# Patient Record
Sex: Female | Born: 1980 | Race: White | Hispanic: No | Smoking: Former smoker
Health system: Southern US, Community
[De-identification: ages and names within clinical notes are randomized; demographics above are authoritative.]

## PROBLEM LIST (undated history)

## (undated) DIAGNOSIS — I10 Essential (primary) hypertension: Secondary | ICD-10-CM

## (undated) DIAGNOSIS — D649 Anemia, unspecified: Secondary | ICD-10-CM

## (undated) HISTORY — PX: CHOLECYSTECTOMY: SHX55

---

## 2005-10-18 ENCOUNTER — Emergency Department: Payer: Self-pay | Admitting: Internal Medicine

## 2006-02-22 ENCOUNTER — Emergency Department: Payer: Self-pay | Admitting: General Practice

## 2006-08-09 ENCOUNTER — Emergency Department: Payer: Self-pay | Admitting: Emergency Medicine

## 2006-11-03 ENCOUNTER — Emergency Department: Payer: Self-pay | Admitting: Unknown Physician Specialty

## 2006-11-05 ENCOUNTER — Emergency Department: Payer: Self-pay | Admitting: Emergency Medicine

## 2007-01-08 ENCOUNTER — Emergency Department: Payer: Self-pay | Admitting: Emergency Medicine

## 2008-01-10 ENCOUNTER — Emergency Department: Payer: Self-pay | Admitting: Internal Medicine

## 2008-01-14 ENCOUNTER — Emergency Department: Payer: Self-pay | Admitting: Emergency Medicine

## 2008-02-05 ENCOUNTER — Emergency Department: Payer: Self-pay | Admitting: Emergency Medicine

## 2008-02-14 ENCOUNTER — Emergency Department: Payer: Self-pay | Admitting: Emergency Medicine

## 2008-05-11 ENCOUNTER — Emergency Department: Payer: Self-pay | Admitting: Emergency Medicine

## 2008-07-16 ENCOUNTER — Emergency Department: Payer: Self-pay

## 2008-08-26 ENCOUNTER — Emergency Department: Payer: Self-pay | Admitting: Emergency Medicine

## 2008-11-08 ENCOUNTER — Observation Stay: Payer: Self-pay | Admitting: Obstetrics and Gynecology

## 2009-01-07 ENCOUNTER — Observation Stay: Payer: Self-pay

## 2009-01-29 ENCOUNTER — Observation Stay: Payer: Self-pay | Admitting: Unknown Physician Specialty

## 2009-02-10 ENCOUNTER — Emergency Department: Payer: Self-pay | Admitting: Emergency Medicine

## 2009-02-18 ENCOUNTER — Observation Stay: Payer: Self-pay | Admitting: Obstetrics and Gynecology

## 2009-03-18 ENCOUNTER — Inpatient Hospital Stay: Payer: Self-pay | Admitting: Unknown Physician Specialty

## 2010-05-26 ENCOUNTER — Inpatient Hospital Stay: Payer: Self-pay | Admitting: Obstetrics and Gynecology

## 2010-08-02 ENCOUNTER — Emergency Department: Payer: Self-pay | Admitting: Emergency Medicine

## 2010-08-03 ENCOUNTER — Emergency Department: Payer: Self-pay | Admitting: Emergency Medicine

## 2011-07-22 ENCOUNTER — Emergency Department: Payer: Self-pay | Admitting: Emergency Medicine

## 2012-05-07 ENCOUNTER — Emergency Department: Payer: Self-pay | Admitting: Emergency Medicine

## 2012-05-07 LAB — URINALYSIS, COMPLETE
Bacteria: NONE SEEN
Bilirubin,UR: NEGATIVE
Glucose,UR: NEGATIVE mg/dL (ref 0–75)
Ketone: NEGATIVE
Nitrite: NEGATIVE
Ph: 6 (ref 4.5–8.0)
Protein: NEGATIVE
RBC,UR: 3 /HPF (ref 0–5)
Specific Gravity: 1.021 (ref 1.003–1.030)
Squamous Epithelial: 18
WBC UR: 13 /HPF (ref 0–5)

## 2012-05-07 LAB — COMPREHENSIVE METABOLIC PANEL
Albumin: 4.1 g/dL (ref 3.4–5.0)
Alkaline Phosphatase: 84 U/L (ref 50–136)
Anion Gap: 3 — ABNORMAL LOW (ref 7–16)
BUN: 12 mg/dL (ref 7–18)
Bilirubin,Total: 0.5 mg/dL (ref 0.2–1.0)
Calcium, Total: 8.7 mg/dL (ref 8.5–10.1)
Chloride: 105 mmol/L (ref 98–107)
Co2: 29 mmol/L (ref 21–32)
Creatinine: 0.83 mg/dL (ref 0.60–1.30)
EGFR (African American): >60
EGFR (Non-African Amer.): >60
Glucose: 88 mg/dL (ref 65–99)
Osmolality: 273 (ref 275–301)
Potassium: 3.8 mmol/L (ref 3.5–5.1)
SGOT(AST): 15 U/L (ref 15–37)
SGPT (ALT): 19 U/L (ref 12–78)
Sodium: 137 mmol/L (ref 136–145)
Total Protein: 7.7 g/dL (ref 6.4–8.2)

## 2012-05-07 LAB — CBC
HCT: 40.8 % (ref 35.0–47.0)
HGB: 13.8 g/dL (ref 12.0–16.0)
MCH: 30.2 pg (ref 26.0–34.0)
MCHC: 33.9 g/dL (ref 32.0–36.0)
MCV: 89 fL (ref 80–100)
Platelet: 204 10*3/uL (ref 150–440)
RBC: 4.58 10*6/uL (ref 3.80–5.20)
RDW: 13.4 % (ref 11.5–14.5)
WBC: 10.7 10*3/uL (ref 3.6–11.0)

## 2012-05-07 LAB — LIPASE, BLOOD: Lipase: 116 U/L (ref 73–393)

## 2012-05-08 LAB — CBC
HCT: 41.4 % (ref 35.0–47.0)
HGB: 13.7 g/dL (ref 12.0–16.0)
MCH: 30 pg (ref 26.0–34.0)
MCHC: 33.1 g/dL (ref 32.0–36.0)
MCV: 91 fL (ref 80–100)
Platelet: 207 10*3/uL (ref 150–440)
RBC: 4.58 10*6/uL (ref 3.80–5.20)
RDW: 13.6 % (ref 11.5–14.5)
WBC: 6.6 10*3/uL (ref 3.6–11.0)

## 2012-12-26 ENCOUNTER — Emergency Department: Payer: Self-pay | Admitting: Emergency Medicine

## 2014-04-11 ENCOUNTER — Emergency Department: Payer: Self-pay | Admitting: Emergency Medicine

## 2014-05-25 LAB — URINALYSIS, COMPLETE
Bilirubin,UR: NEGATIVE
Blood: NEGATIVE
Glucose,UR: NEGATIVE mg/dL (ref 0–75)
Ketone: NEGATIVE
Nitrite: NEGATIVE
Ph: 8 (ref 4.5–8.0)
Protein: 30
RBC,UR: 7 /HPF (ref 0–5)
Specific Gravity: 1.017 (ref 1.003–1.030)
Squamous Epithelial: 2
WBC UR: 28 /HPF (ref 0–5)

## 2014-05-25 LAB — COMPREHENSIVE METABOLIC PANEL
Albumin: 4.4 g/dL
Alkaline Phosphatase: 63 U/L
Anion Gap: 7 (ref 7–16)
BUN: 13 mg/dL
Bilirubin,Total: 0.3 mg/dL
Calcium, Total: 9.1 mg/dL
Chloride: 106 mmol/L
Co2: 26 mmol/L
Creatinine: 1.05 mg/dL — ABNORMAL HIGH
EGFR (African American): >60
EGFR (Non-African Amer.): >60
Glucose: 114 mg/dL — ABNORMAL HIGH
Potassium: 3.7 mmol/L
SGOT(AST): 21 U/L
SGPT (ALT): 16 U/L
Sodium: 139 mmol/L
Total Protein: 7.5 g/dL

## 2014-05-25 LAB — CBC WITH DIFFERENTIAL/PLATELET
Basophil #: 0.1 10*3/uL (ref 0.0–0.1)
Basophil %: 0.7 %
Eosinophil #: 0.3 10*3/uL (ref 0.0–0.7)
Eosinophil %: 2.5 %
HCT: 42.2 % (ref 35.0–47.0)
HGB: 14.1 g/dL (ref 12.0–16.0)
Lymphocyte #: 2.7 10*3/uL (ref 1.0–3.6)
Lymphocyte %: 25.5 %
MCH: 31.2 pg (ref 26.0–34.0)
MCHC: 33.5 g/dL (ref 32.0–36.0)
MCV: 93 fL (ref 80–100)
Monocyte #: 0.5 x10 3/mm (ref 0.2–0.9)
Monocyte %: 5.3 %
Neutrophil #: 6.9 10*3/uL — ABNORMAL HIGH (ref 1.4–6.5)
Neutrophil %: 66 %
Platelet: 229 10*3/uL (ref 150–440)
RBC: 4.54 10*6/uL (ref 3.80–5.20)
RDW: 13.2 % (ref 11.5–14.5)
WBC: 10.4 10*3/uL (ref 3.6–11.0)

## 2014-05-25 LAB — LIPASE, BLOOD: Lipase: 55 U/L — ABNORMAL HIGH

## 2014-05-25 LAB — TROPONIN I: Troponin-I: <0.03 ng/mL

## 2014-05-26 ENCOUNTER — Observation Stay
Admit: 2014-05-26 | Disposition: A | Discharge status: Home / Self Care | Payer: Self-pay | Attending: Surgery | Admitting: Surgery

## 2014-06-18 LAB — SURGICAL PATHOLOGY

## 2014-06-23 ENCOUNTER — Emergency Department: Admit: 2014-06-23 | Disposition: A | Discharge status: Home / Self Care | Payer: Self-pay | Admitting: Internal Medicine

## 2014-06-24 NOTE — Op Note (Signed)
PATIENT NAME:  Carmen Turner, Carmen A MR#:  [id number] DATE OF BIRTH:  07/18/1980  DATE OF PROCEDURE:  05/26/2014  OPERATION PERFORMED: Laparoscopic cholecystectomy.   PREOPERATIVE DIAGNOSIS: Acute cholecystitis.   POSTOPERATIVE DIAGNOSIS: Acute cholecystitis, mild.   SURGEON: Claude MangesWilliam F. Marterre, MD   ANESTHESIA: General.   PROCEDURE IN DETAIL: The patient was placed supine on the operating room table and prepped and draped in the usual sterile fashion. A 15 mmHg CO2 pneumoperitoneum was created via a Veress needle in the infraumbilical position, and this was replaced with a 5 mm trocar and a 30 degrees angled laparoscope. Remaining trocars were placed under direct visualization. The fundus of the gallbladder was retracted superiorly and ventrally and there was a significant amount of edema in the triangle of Calot, but there was no exudate. Adhesions were minimal. Dissection within the triangle of Calot was facilitated by retracting the infundibulum of the gallbladder laterally and cystic duct was identified going into the infundibulum and this was doubly clipped and divided. There were multiple small cystic artery branches and the patient had  oozing from little tiny skin sites such as produced by cutting needles and, therefore, I placed a multiple hemoclips on these multiple small branches, but none of these clips encroached upon the area of the porta hepatis or common bile duct. The gallbladder was removed from the liver bed with the electrocautery, placed in an EndoCatch bag and extracted from the abdomen via the epigastric port. This port site fascia was closed with a single 0 Vicryl suture using the laparoscopic puncture closure device. The right upper quadrant was irrigated with copious amounts of warm normal saline and this was all suctioned clear. Hemostasis was excellent and the clips were secure at the conclusion of the procedure and, therefore, the peritoneum was desufflated and decannulated and  all 4 skin sites were closed with subcuticular 5-0 Monocryl and suture strips. The patient tolerated the procedure well. There were no complications.    ____________________________ Claude MangesWilliam F. Marterre, MD wfm:tr D: 05/26/2014 09:37:36 ET T: 05/26/2014 11:55:33 ET JOB#: [id number]  cc: Claude MangesWilliam F. Marterre, MD, <Dictator> Claude MangesWILLIAM F MARTERRE MD ELECTRONICALLY SIGNED 05/26/2014 16:23

## 2014-06-24 NOTE — H&P (Signed)
   Subjective/Chief Complaint Epigastric pain x 18 hours   History of Present Illness 34 yo F with history of c section who presents with now approx 18 hours of epigastric pain.  Has had multiple episodes over the past few years, had ultrasound in February which shows gallstones.  + N/V due to pain, chills.  Pain slightly improved in ER after pain meds.  No sick contracts, no unusual ingestions.  No fevers, night sweats, cough, chest pain, shortness of breath, diarrhea/constipation, dysuria/hematuria.   Past History H/o c section   Code Status Full Code   Past Med/Surgical Hx:  intestinal blockage at [age]yrs old:   Cesarean Section:   Denies surgical history.:   ALLERGIES:  Ibuprofen: Swelling  Family and Social History:  Family History Cancer   Social History negative tobacco, positive ETOH, Social EtOH   Place of Living Home   Review of Systems:  Subjective/Chief Complaint Epigastric pain, N/V.  Full ROS obtained, pertinent positives and negatives above in HPI and below   Fever/Chills Yes   Cough No   Sputum No   Abdominal Pain Yes   Diarrhea No   Constipation No   Nausea/Vomiting Yes   SOB/DOE No   Chest Pain No   Dysuria No   Tolerating Diet No  Nauseated  Vomiting   Physical Exam:  GEN well developed, no acute distress   HEENT pink conjunctivae, PERRL, hearing intact to voice, good dentition   RESP normal resp effort  clear BS  no use of accessory muscles   CARD regular rate  no murmur  no thrills  Mildly tender epigastric   ABD positive tenderness  no hernia  soft   EXTR negative cyanosis/clubbing, negative edema   SKIN normal to palpation, No rashes, No ulcers, skin turgor good   NEURO cranial nerves intact, negative rigidity, negative tremor, follows commands, strength:, motor/sensory function intact   PSYCH A+O to time, place, person, good insight    Assessment/Admission Diagnosis 34 yo with epigastric pain, recurrent, N/V.  Labs nl, VSS  but U/S shows gallstone lodged in neck of gallbladder.  Suspect gallbladder hydrops.   Plan Admit, IV fluids, IV abx.  NPO.  Plan laparoscopic cholecystectomy at discretion of daytime surgeon Dr. Anda KraftMarterre.   Electronic Signatures: Jarvis NewcomerLundquist, Christopher A (MD)  (Signed 02-Apr-16 01:01)  Authored: CHIEF COMPLAINT and HISTORY, PAST MEDICAL/SURGIAL HISTORY, ALLERGIES, FAMILY AND SOCIAL HISTORY, REVIEW OF SYSTEMS, PHYSICAL EXAM, ASSESSMENT AND PLAN   Last Updated: 02-Apr-16 01:01 by Jarvis NewcomerLundquist, Christopher A (MD)

## 2014-07-08 ENCOUNTER — Encounter: Payer: Self-pay | Admitting: Emergency Medicine

## 2014-07-08 ENCOUNTER — Emergency Department: Payer: Medicaid Other

## 2014-07-08 ENCOUNTER — Emergency Department
Admission: EM | Admit: 2014-07-08 | Discharge: 2014-07-08 | Disposition: A | Discharge status: Home / Self Care | Payer: Medicaid Other | Attending: Emergency Medicine | Admitting: Emergency Medicine

## 2014-07-08 DIAGNOSIS — Y9389 Activity, other specified: Secondary | ICD-10-CM | POA: Insufficient documentation

## 2014-07-08 DIAGNOSIS — S92301A Fracture of unspecified metatarsal bone(s), right foot, initial encounter for closed fracture: Secondary | ICD-10-CM

## 2014-07-08 DIAGNOSIS — Y998 Other external cause status: Secondary | ICD-10-CM | POA: Insufficient documentation

## 2014-07-08 DIAGNOSIS — S62306A Unspecified fracture of fifth metacarpal bone, right hand, initial encounter for closed fracture: Secondary | ICD-10-CM | POA: Insufficient documentation

## 2014-07-08 DIAGNOSIS — W231XXA Caught, crushed, jammed, or pinched between stationary objects, initial encounter: Secondary | ICD-10-CM | POA: Insufficient documentation

## 2014-07-08 DIAGNOSIS — Y9289 Other specified places as the place of occurrence of the external cause: Secondary | ICD-10-CM | POA: Insufficient documentation

## 2014-07-08 MED ORDER — TRAMADOL HCL 50 MG PO TABS
50.0000 mg | ORAL_TABLET | Freq: Four times a day (QID) | ORAL | Status: DC | PRN
Start: 2014-07-08 — End: 2015-11-09

## 2014-07-08 NOTE — ED Notes (Signed)
Pt says her daughter accidentally closed her right hand in a doorway in their house; pt with swelling across the back of her hand/knuckle area and bruising to palm; pt unable to move 4th and 5th digits due to increased pain;

## 2014-07-08 NOTE — ED Provider Notes (Signed)
CSN: [id number]     Arrival date & time 07/08/14  2029 History   First MD Initiated Contact with Patient 07/08/14 2150     Chief Complaint  Patient presents with  . Hand Injury     (Consider location/radiation/quality/duration/timing/severity/associated sxs/prior Treatment) HPI patient notes that her hand was caught in her door 4 days ago has had increased swelling pain and discomfort is here today for evaluation of possible fracture says she is hardly able to move her fourth and fifth digits should increase in pain pain is limited when she holds her hands steady denies numbness tingling weakness in the extremity it's her overall pain at the worst is about 8 out of 10  History reviewed. No pertinent past medical history. Past Surgical History  Procedure Laterality Date  . Cholecystectomy    . Cesarean section     No family history on file. History  Substance Use Topics  . Smoking status: Never Smoker   . Smokeless tobacco: Never Used  . Alcohol Use: Yes   OB History    No data available     Review of Systems  Constitutional: Negative for fever. Cardiovascular: Negative for chest pain. Respiratory: Negative for shortness of breath. Gastrointestinal: Negative for abdominal pain, vomiting and diarrhea. Genitourinary: Negative for dysuria. Musculoskeletal: Negative for back pain. Pain and swelling to the right hand Skin: Negative for rash.  Neurological: Negative for headaches, focal weakness or numbness.  Allergies  Ibuprofen  Home Medications   Prior to Admission medications   Medication Sig Start Date End Date Taking? Authorizing Provider  traMADol (ULTRAM) 50 MG tablet Take 1 tablet (50 mg total) by mouth every 6 (six) hours as needed. 07/08/14   III William C Ruffian, PA-C   BP 154/96 mmHg  Pulse 96  Temp(Src) 98.1 F (36.7 C) (Oral)  Resp 20  Ht 5\' 3"  (1.6 m)  Wt 189 lb (85.73 kg)  BMI 33.49 kg/m2  SpO2 99%  LMP 06/26/2014 Physical Exam Female appearing  stated age well-developed well-nourished no acute distress vitals reviewed Head ears eyes nose neck and throat exam unremarkable Cardia vascular regular rate and rhythm no murmurs or gallops  Refill good distal pulses bilaterally Pulmonary lungs clear to auscultation bilaterally Neuro exam nonfocal cranial nerves II through XII grossly intact good sensation throughout distal extremity Skin there is bruising to the right hand over the fourth and fifth metacarpals Muscular low chest pain with palpation over the distal fifth metacarpal of the right hand limited range of motion for complete flexion of her fingers on her right hand due to both swelling and pain ED Course  Procedures patient placed in an ulnar gutter splint  Imaging Review Dg Hand 2 View Right  07/08/2014   CLINICAL DATA:  Right hand slammed in door, with swelling and bruising about the palm and dorsal aspect of the right hand. Limited range of motion. Initial encounter.  EXAM: RIGHT HAND - 2 VIEW  COMPARISON:  None.  FINDINGS: There is a slightly comminuted fracture involving the distal fifth metacarpal, with extension to the ulnar edge of the fifth metacarpophalangeal joint. No additional fractures are seen.  The joint spaces are preserved. The carpal rows are intact, and demonstrate normal alignment. No definite soft tissue abnormalities are characterized on radiograph.  IMPRESSION: Slightly comminuted fracture involving the distal fifth metacarpal, with extension to the ulnar edge of the fifth metacarpophalangeal joint.   Electronically Signed   By: Roanna RaiderJeffery  Chang M.D.   On: 07/08/2014 21:53  MDM  Patient to has bruising to her right hand and pain that has been going on since Thursday today being Sunday with findings of a slightly comminuted fracture involving the distal fifth metacarpal patient was placed in an ulnar gutter splints to follow-up with orthopedics next week 2. Elevated iced take tramadol and Tylenol as needed for  pain Final diagnoses:  Fracture of fifth metatarsal bone, right, closed, initial encounter        III Rosalyn GessWilliam C Ruffian, PA-C 07/08/14 2228  Sharyn CreamerMark Quale, MD 07/08/14 2333

## 2014-07-08 NOTE — ED Notes (Signed)
Pt has pain 8/10, capillary refill less than 3 seconds, pulses present, and pt tolerated procedure well.

## 2014-11-15 ENCOUNTER — Emergency Department
Admission: EM | Admit: 2014-11-15 | Discharge: 2014-11-15 | Disposition: A | Discharge status: Home / Self Care | Payer: Medicaid Other | Attending: Emergency Medicine | Admitting: Emergency Medicine

## 2014-11-15 ENCOUNTER — Emergency Department: Payer: Medicaid Other

## 2014-11-15 DIAGNOSIS — Z791 Long term (current) use of non-steroidal anti-inflammatories (NSAID): Secondary | ICD-10-CM | POA: Insufficient documentation

## 2014-11-15 DIAGNOSIS — M79672 Pain in left foot: Secondary | ICD-10-CM

## 2014-11-15 DIAGNOSIS — I10 Essential (primary) hypertension: Secondary | ICD-10-CM | POA: Insufficient documentation

## 2014-11-15 HISTORY — DX: Essential (primary) hypertension: I10

## 2014-11-15 MED ORDER — NAPROXEN 500 MG PO TABS: 500.0000 mg | ORAL_TABLET | Freq: Two times a day (BID) | ORAL | Status: DC

## 2014-11-15 NOTE — ED Notes (Signed)
Pt presents to ED w/ c/o L foot pain.  Pt sts it began today.  Pt denies injury or bite to extremity

## 2014-11-15 NOTE — ED Notes (Signed)
Pt's bp rechecked. States she was recently diagnosed with htn but has no insurance at this time. [facility].

## 2014-11-15 NOTE — Discharge Instructions (Signed)
Cryotherapy Cryotherapy is when you put ice on your injury. Ice helps lessen pain and puffiness (swelling) after an injury. Ice works the best when you start using it in the first 24 to 48 hours after an injury. HOME CARE  Put a dry or damp towel between the ice pack and your skin.  You may press gently on the ice pack.  Leave the ice on for no more than 10 to 20 minutes at a time.  Check your skin after 5 minutes to make sure your skin is okay.  Rest at least 20 minutes between ice pack uses.  Stop using ice when your skin loses feeling (numbness).  Do not use ice on someone who cannot tell you when it hurts. This includes small children and people with memory problems (dementia). GET HELP RIGHT AWAY IF:  You have white spots on your skin.  Your skin turns blue or pale.  Your skin feels waxy or hard.  Your puffiness gets worse. MAKE SURE YOU:   Understand these instructions.  Will watch your condition.  Will get help right away if you are not doing well or get worse. Document Released: 07/29/2007 Document Revised: 05/04/2011 Document Reviewed: 10/02/2010 ExitCare Patient Information 2015 [employer]. This information is not intended to replace advice given to you by your health care provider. Make sure you discuss any questions you have with your health care provider.  

## 2014-11-15 NOTE — ED Provider Notes (Signed)
[facility] Emergency Department Provider Note  ____________________________________________  Time seen: Approximately 11:25 AM  I have reviewed the triage vital signs and the nursing notes.   HISTORY  Chief Complaint Foot Pain    HPI Carmen Turner is a 34 y.o. female chief complaint left foot pain. Patient denies any injury to her foot or insect bite. She states this is been going on for approximately 2 days. Pain is increased with walking. She has not taken any over-the-counter medication for her pain. She denies any family history or known history of gout. Pain is mostly on the left lateral aspect of her foot. She rates her pain currently as a 7 out of 10.   Past Medical History  Diagnosis Date  . Hypertension     There are no active problems to display for this patient.   Past Surgical History  Procedure Laterality Date  . Cholecystectomy    . Cesarean section      Current Outpatient Rx  Name  Route  Sig  Dispense  Refill  . naproxen (NAPROSYN) 500 MG tablet   Oral   Take 1 tablet (500 mg total) by mouth 2 (two) times daily with a meal.   30 tablet   0   . traMADol (ULTRAM) 50 MG tablet   Oral   Take 1 tablet (50 mg total) by mouth every 6 (six) hours as needed.   12 tablet   0     Allergies Ibuprofen  No family history on file.  Social History Social History  Substance Use Topics  . Smoking status: Never Smoker   . Smokeless tobacco: Never Used  . Alcohol Use: Yes    Review of Systems Constitutional: No fever/chills Eyes: No visual changes. ENT: No sore throat. Cardiovascular: Denies chest pain. Respiratory: Denies shortness of breath. Gastrointestinal: No abdominal pain.  No nausea, no vomiting.   Musculoskeletal: Negative for back pain. Positive for left foot pain Skin: Negative for rash. Neurological: Negative for headaches, focal weakness or numbness.  10-point ROS otherwise  negative.  ____________________________________________   PHYSICAL EXAM:  VITAL SIGNS: ED Triage Vitals  Enc Vitals Group     BP 11/15/14 1107 157/107 mmHg     Pulse Rate 11/15/14 1107 67     Resp 11/15/14 1107 16     Temp 11/15/14 1107 97.9 F (36.6 C)     Temp Source 11/15/14 1107 Oral     SpO2 11/15/14 1107 99 %     Weight 11/15/14 1107 139 lb (63.05 kg)     Height 11/15/14 1107  (1.6 m)     Head Cir --      Peak Flow --      Pain Score 11/15/14 1108 7     Pain Loc --      Pain Edu? --      Excl. in GC? --     Constitutional: Alert and oriented. Well appearing and in no acute distress. Eyes: Conjunctivae are normal. PERRL. EOMI. Head: Atraumatic. Nose: No congestion/rhinnorhea. Neck: No stridor.   Cardiovascular: Normal rate, regular rhythm. Grossly normal heart sounds.  Good peripheral circulation. Respiratory: Normal respiratory effort.  No retractions. Lungs CTAB. Gastrointestinal: Soft and nontender. No distention. Musculoskeletal: Moderate tenderness on palpation of the left lateral fifth metatarsal area. No gross deformity was noted that there is some soft tissue swelling. No lower extremity tenderness nor edema.  No joint effusions. Neurologic:  Normal speech and language. No gross focal neurologic deficits  are appreciated. No gait instability. Skin:  Skin is warm, dry and intact. No rash noted. There is no erythema noted on the foot. There is one area that looks like a dry skin area. Patient states this is from the particular she she's been wearing. Psychiatric: Mood and affect are normal. Speech and behavior are normal.  ____________________________________________   LABS (all labs ordered are listed, but only abnormal results are displayed)  Labs Reviewed - No data to display RADIOLOGY  Negative for fracture left foot I, Tommi Rumps, personally viewed and evaluated these images (plain radiographs) as part of my medical decision making.   ____________________________________________   PROCEDURES  Procedure(s) performed: None  Critical Care performed: No  ____________________________________________   INITIAL IMPRESSION / ASSESSMENT AND PLAN / ED COURSE  Pertinent labs & imaging results that were available during my care of the patient were reviewed by me and considered in my medical decision making (see chart for details)  Patient was placed in a wooden shoe. She is also given a prescription for naproxen 500 mg twice a day is to follow-up with podiatrist if any continued problems. Patient's blood pressure was brought to attention. She is also [facility].   ____________________________________________   FINAL CLINICAL IMPRESSION(S) / ED DIAGNOSES  Final diagnoses:  Foot pain, left      Tommi Rumps, PA-C 11/15/14 1604  Tommi Rumps, PA-C 11/15/14 1605  Phineas Semen, MD 11/15/14 [phone]

## 2014-11-20 ENCOUNTER — Emergency Department
Admission: EM | Admit: 2014-11-20 | Discharge: 2014-11-20 | Disposition: A | Discharge status: Home / Self Care | Payer: Medicaid Other | Attending: Emergency Medicine | Admitting: Emergency Medicine

## 2014-11-20 ENCOUNTER — Emergency Department: Payer: Medicaid Other

## 2014-11-20 DIAGNOSIS — R609 Edema, unspecified: Secondary | ICD-10-CM

## 2014-11-20 DIAGNOSIS — R52 Pain, unspecified: Secondary | ICD-10-CM

## 2014-11-20 DIAGNOSIS — Z87891 Personal history of nicotine dependence: Secondary | ICD-10-CM | POA: Insufficient documentation

## 2014-11-20 DIAGNOSIS — M79672 Pain in left foot: Secondary | ICD-10-CM | POA: Insufficient documentation

## 2014-11-20 DIAGNOSIS — Z791 Long term (current) use of non-steroidal anti-inflammatories (NSAID): Secondary | ICD-10-CM | POA: Insufficient documentation

## 2014-11-20 DIAGNOSIS — I10 Essential (primary) hypertension: Secondary | ICD-10-CM | POA: Insufficient documentation

## 2014-11-20 MED ORDER — CEPHALEXIN 500 MG PO CAPS: 500.0000 mg | ORAL_CAPSULE | Freq: Four times a day (QID) | ORAL | Status: AC

## 2014-11-20 NOTE — ED Notes (Signed)
Pt c/o swelling with tender red area to the left outer calf that she noticed this morning, states she has been having swelling and was seen here last Thursday for swelling of the foot and Rx naproxen now noticed the area to the calf and concerned for DVT,

## 2014-11-20 NOTE — ED Provider Notes (Signed)
[facility] Emergency Department Provider Note    ____________________________________________  Time seen: 1450  I have reviewed the triage vital signs and the nursing notes.   HISTORY  Chief Complaint Leg Swelling   History limited by: Not Limited   HPI Carmen Turner is a 34 y.o. female who presents to the emergency department today with concerns for left foot and lower leg swelling. The patient has now had the swelling for roughly 1 week. The patient was seen in the emergency department roughly 1 week ago for foot swelling. At that time and so was done which was negative. She states that since that time the swelling has been constant. There is some pain associated with it. She has tried taking approximately minimal pain relief. The patient states that this morning she felt like the swelling was moving up her calf. She denies any recent fevers, nausea or vomiting. Denies any trauma to her foot or leg.  Past Medical History  Diagnosis Date  . Hypertension     There are no active problems to display for this patient.   Past Surgical History  Procedure Laterality Date  . Cholecystectomy    . Cesarean section      Current Outpatient Rx  Name  Route  Sig  Dispense  Refill  . naproxen (NAPROSYN) 500 MG tablet   Oral   Take 1 tablet (500 mg total) by mouth 2 (two) times daily with a meal.   30 tablet   0   . traMADol (ULTRAM) 50 MG tablet   Oral   Take 1 tablet (50 mg total) by mouth every 6 (six) hours as needed.   12 tablet   0     Allergies Ibuprofen  No family history on file.  Social History Social History  Substance Use Topics  . Smoking status: Former Games developer  . Smokeless tobacco: Never Used  . Alcohol Use: Yes    Review of Systems  Constitutional: Negative for fever. Cardiovascular: Negative for chest pain. Respiratory: Negative for shortness of breath. Gastrointestinal: Negative for abdominal pain, vomiting and  diarrhea. Genitourinary: Negative for dysuria. Musculoskeletal: Negative for back pain. Left foot and leg swelling and pain Skin: Negative for rash. Neurological: Negative for headaches, focal weakness or numbness.  10-point ROS otherwise negative.  ____________________________________________   PHYSICAL EXAM:  VITAL SIGNS: ED Triage Vitals  Enc Vitals Group     BP 11/20/14 1154 186/99 mmHg     Pulse Rate 11/20/14 1154 85     Resp 11/20/14 1154 18     Temp 11/20/14 1154 98 F (36.7 C)     Temp Source 11/20/14 1154 Oral     SpO2 11/20/14 1154 98 %     Weight 11/20/14 1154 139 lb (63.05 kg)     Height 11/20/14 1154  (1.6 m)     Head Cir --      Peak Flow --      Pain Score 11/20/14 1155 7   Constitutional: Alert and oriented. Well appearing and in no distress. Eyes: Conjunctivae are normal. PERRL. Normal extraocular movements. ENT   Head: Normocephalic and atraumatic.   Nose: No congestion/rhinnorhea.   Mouth/Throat: Mucous membranes are moist.   Neck: No stridor. Hematological/Lymphatic/Immunilogical: No cervical lymphadenopathy. Cardiovascular: Normal rate, regular rhythm.  No murmurs, rubs, or gallops. Respiratory: Normal respiratory effort without tachypnea nor retractions. Breath sounds are clear and equal bilaterally. No wheezes/rales/rhonchi. Musculoskeletal: Mild swelling to the dorsum of the left foot. Small well-healing  lesion to the lateral aspect of the midfoot. Some erythema to the dorsum of the foot. No appreciable swelling of the leg. No pitting edema. Neurovascularly intact. Neurologic:  Normal speech and language. No gross focal neurologic deficits are appreciated. Speech is normal.  Skin:  Skin is warm, dry and intact. No rash noted. Psychiatric: Mood and affect are normal. Speech and behavior are normal. Patient exhibits appropriate insight and judgment.  ____________________________________________    LABS (pertinent  positives/negatives)  None  ____________________________________________   EKG  None  ____________________________________________    RADIOLOGY  Lower extremity Doppler  IMPRESSION: 1. No evidence of lower extremity deep vein thrombosis, LEFT.   ____________________________________________   PROCEDURES  Procedure(s) performed: None  Critical Care performed: No  ____________________________________________   INITIAL IMPRESSION / ASSESSMENT AND PLAN / ED COURSE  Pertinent labs & imaging results that were available during my care of the patient were reviewed by me and considered in my medical decision making (see chart for details).  Patient presented to the emergency department today with concerns for left foot and leg swelling. Ultrasound which does done did not show any blood clots. There is a small amount of erythema to the dorsum of the foot. This coupled with the fact that there was a small wound to the foot raises concerns for cellulitis. Will discharge with antibiotics and podiatry follow-up.  ____________________________________________   FINAL CLINICAL IMPRESSION(S) / ED DIAGNOSES  Final diagnoses:  Swelling  Pain     Phineas Semen, MD 11/20/14 1510

## 2014-11-20 NOTE — ED Notes (Signed)
Pt c/o swelling with tender red area to the left outer calf that she noticed this morning, states she has been having swelling and was seen here last Thursday for swelling of the foot and Rx naproxen now noticed the area to the calf and concerned for DVT. [place] ordered out in triage will review the results.

## 2014-11-20 NOTE — Discharge Instructions (Signed)
Please seek medical attention for any high fevers, chest pain, shortness of breath, change in behavior, persistent vomiting, bloody stool or any other new or concerning symptoms. ° °Cellulitis °Cellulitis is an infection of the skin and the tissue beneath it. The infected area is usually red and tender. Cellulitis occurs most often in the arms and lower legs.  °CAUSES  °Cellulitis is caused by bacteria that enter the skin through cracks or cuts in the skin. The most common types of bacteria that cause cellulitis are staphylococci and streptococci. °SIGNS AND SYMPTOMS  °· Redness and warmth. °· Swelling. °· Tenderness or pain. °· Fever. °DIAGNOSIS  °Your health care provider can usually determine what is wrong based on a physical exam. Blood tests may also be done. °TREATMENT  °Treatment usually involves taking an antibiotic medicine. °HOME CARE INSTRUCTIONS  °· Take your antibiotic medicine as directed by your health care provider. Finish the antibiotic even if you start to feel better. °· Keep the infected arm or leg elevated to reduce swelling. °· Apply a warm cloth to the affected area up to 4 times per day to relieve pain. °· Take medicines only as directed by your health care provider. °· Keep all follow-up visits as directed by your health care provider. °SEEK MEDICAL CARE IF:  °· You notice red streaks coming from the infected area. °· Your red area gets larger or turns dark in color. °· Your bone or joint underneath the infected area becomes painful after the skin has healed. °· Your infection returns in the same area or another area. °· You notice a swollen bump in the infected area. °· You develop new symptoms. °· You have a fever. °SEEK IMMEDIATE MEDICAL CARE IF:  °· You feel very sleepy. °· You develop vomiting or diarrhea. °· You have a general ill feeling (malaise) with muscle aches and pains. °MAKE SURE YOU:  °· Understand these instructions. °· Will watch your condition. °· Will get help right away  if you are not doing well or get worse. °Document Released: 11/19/2004 Document Revised: 06/26/2013 Document Reviewed: 04/27/2011 °Exit[employer]® Patient Information ©2015 [employer]. This information is not intended to replace advice given to you by your health care provider. Make sure you discuss any questions you have with your health care provider. ° °

## 2014-12-08 ENCOUNTER — Emergency Department
Admission: EM | Admit: 2014-12-08 | Discharge: 2014-12-08 | Disposition: A | Discharge status: Home / Self Care | Payer: Medicaid Other | Attending: Emergency Medicine | Admitting: Emergency Medicine

## 2014-12-08 ENCOUNTER — Encounter: Payer: Self-pay | Admitting: Emergency Medicine

## 2014-12-08 DIAGNOSIS — I10 Essential (primary) hypertension: Secondary | ICD-10-CM | POA: Insufficient documentation

## 2014-12-08 DIAGNOSIS — Z791 Long term (current) use of non-steroidal anti-inflammatories (NSAID): Secondary | ICD-10-CM | POA: Insufficient documentation

## 2014-12-08 DIAGNOSIS — M79672 Pain in left foot: Secondary | ICD-10-CM | POA: Insufficient documentation

## 2014-12-08 DIAGNOSIS — K6289 Other specified diseases of anus and rectum: Secondary | ICD-10-CM | POA: Insufficient documentation

## 2014-12-08 DIAGNOSIS — Z87891 Personal history of nicotine dependence: Secondary | ICD-10-CM | POA: Insufficient documentation

## 2014-12-08 LAB — CBC WITH DIFFERENTIAL/PLATELET
Basophils Absolute: 0.1 10*3/uL (ref 0–0.1)
Basophils Relative: 1 %
Eosinophils Absolute: 0.3 10*3/uL (ref 0–0.7)
Eosinophils Relative: 4 %
HCT: 42.6 % (ref 35.0–47.0)
Hemoglobin: 14.5 g/dL (ref 12.0–16.0)
Lymphocytes Relative: 33 %
Lymphs Abs: 2.7 10*3/uL (ref 1.0–3.6)
MCH: 31.5 pg (ref 26.0–34.0)
MCHC: 33.9 g/dL (ref 32.0–36.0)
MCV: 92.8 fL (ref 80.0–100.0)
Monocytes Absolute: 0.5 10*3/uL (ref 0.2–0.9)
Monocytes Relative: 6 %
Neutro Abs: 4.7 10*3/uL (ref 1.4–6.5)
Neutrophils Relative %: 56 %
Platelets: 226 10*3/uL (ref 150–440)
RBC: 4.59 MIL/uL (ref 3.80–5.20)
RDW: 13.5 % (ref 11.5–14.5)
WBC: 8.3 10*3/uL (ref 3.6–11.0)

## 2014-12-08 LAB — BASIC METABOLIC PANEL
Anion gap: 5 (ref 5–15)
BUN: 11 mg/dL (ref 6–20)
CO2: 29 mmol/L (ref 22–32)
Calcium: 9.4 mg/dL (ref 8.9–10.3)
Chloride: 103 mmol/L (ref 101–111)
Creatinine, Ser: 0.93 mg/dL (ref 0.44–1.00)
GFR calc Af Amer: >60 mL/min (ref >60)
GFR calc non Af Amer: >60 mL/min (ref >60)
Glucose, Bld: 89 mg/dL (ref 65–99)
Potassium: 4.2 mmol/L (ref 3.5–5.1)
Sodium: 137 mmol/L (ref 135–145)

## 2014-12-08 MED ORDER — TRAMADOL HCL 50 MG PO TABS: 50.0000 mg | ORAL_TABLET | Freq: Four times a day (QID) | ORAL | Status: DC | PRN

## 2014-12-08 NOTE — ED Provider Notes (Signed)
[facility] Emergency Department Provider Note  ____________________________________________  Time seen: Approximately 1:37 PM  I have reviewed the triage vital signs and the nursing notes.   HISTORY  Chief Complaint Foot Pain    HPI Carmen Turner is a 34 y.o. female this patient third visit for left foot pain. Patient first visit she had an ultrasound was negative for any DVT. On the patient's second visit with no with some erythema on the dorsal aspect of the foot to display some antibodies and it x-ray was negative for fracture. Patient states she is having continual pain and rectal showed she did not follow-up with podiatry as directed on her last visit. Patient requested strong antibody can pain medication. Patient is rating her pain as a 6/10.  Past Medical History  Diagnosis Date  . Hypertension     There are no active problems to display for this patient.   Past Surgical History  Procedure Laterality Date  . Cholecystectomy    . Cesarean section      Current Outpatient Rx  Name  Route  Sig  Dispense  Refill  . naproxen (NAPROSYN) 500 MG tablet   Oral   Take 1 tablet (500 mg total) by mouth 2 (two) times daily with a meal.   30 tablet   0   . traMADol (ULTRAM) 50 MG tablet   Oral   Take 1 tablet (50 mg total) by mouth every 6 (six) hours as needed.   12 tablet   0     Allergies Ibuprofen  History reviewed. No pertinent family history.  Social History Social History  Substance Use Topics  . Smoking status: Former Games developer  . Smokeless tobacco: Never Used  . Alcohol Use: Yes    Review of Systems Constitutional: No fever/chills Eyes: No visual changes. ENT: No sore throat. Cardiovascular: Denies chest pain. Respiratory: Denies shortness of breath. Gastrointestinal: No abdominal pain.  No nausea, no vomiting.  No diarrhea.  No constipation. Genitourinary: Negative for dysuria. Musculoskeletal:Left foot pain  Skin:  Negative for rash. Neurological: Negative for headaches, focal weakness or numbness. Endocrine:Hypertension  Allergic/Immunilogical: Ibuprofen   ____________________________________________   PHYSICAL EXAM:  VITAL SIGNS: ED Triage Vitals  Enc Vitals Group     BP 12/08/14 1250 160/115 mmHg     Pulse Rate 12/08/14 1250 69     Resp 12/08/14 1250 20     Temp 12/08/14 1250 98.2 F (36.8 C)     Temp Source 12/08/14 1250 Oral     SpO2 12/08/14 1250 97 %     Weight 12/08/14 1250 139 lb (63.05 kg)     Height --      Head Cir --      Peak Flow --      Pain Score 12/08/14 1251 6     Pain Loc --      Pain Edu? --      Excl. in GC? --     Constitutional: Alert and oriented. Well appearing and in no acute distress. Eyes: Conjunctivae are normal. PERRL. EOMI. Head: Atraumatic. Nose: No congestion/rhinnorhea. Mouth/Throat: Mucous membranes are moist.  Oropharynx non-erythematous. Neck: No stridor.  No cervical spine tenderness to palpation. Hematological/Lymphatic/Immunilogical: No cervical lymphadenopathy. Cardiovascular: Normal rate, regular rhythm. Grossly normal heart sounds.  Good peripheral circulation. Respiratory: Normal respiratory effort.  No retractions. Lungs CTAB. Gastrointestinal: Soft and nontender. No distention. No abdominal bruits. No CVA tenderness. Musculoskeletal: Patient has bilateral high arches mild edema to the dorsal aspect of  the foot. Neurovascular intact free nuchal range of motion. Is no pitting edema.  Neurologic:  Normal speech and language. No gross focal neurologic deficits are appreciated. No gait instability. Skin:  Skin is warm, dry and intact. No rash noted. Psychiatric: Mood and affect are normal. Speech and behavior are normal.  ____________________________________________   LABS (all labs ordered are listed, but only abnormal results are displayed)  Labs Reviewed  BASIC METABOLIC PANEL  CBC WITH DIFFERENTIAL/PLATELET    ____________________________________________  EKG   ____________________________________________  RADIOLOGY  Reviewed unremarkable ultrasound and x-ray from her last visits. ____________________________________________   PROCEDURES  Procedure(s) performed: None  Critical Care performed: No  ____________________________________________   INITIAL IMPRESSION / ASSESSMENT AND PLAN / ED COURSE  Pertinent labs & imaging results that were available during my care of the patient were reviewed by me and considered in my medical decision making (see chart for details). Left foot pain. Discussed labs x-ray and ultrasound results with patient. Advised patient to follow-up with podiatry for definitive diagnosis. Patient given a prescription for tramadol for pain. ____________________________________________   FINAL CLINICAL IMPRESSION(S) / ED DIAGNOSES  Final diagnoses:  Left foot pain      Joni ReiningRonald K Smith, PA-C 12/08/14 1436  Phineas SemenGraydon Goodman, MD 12/08/14 [id number]

## 2014-12-08 NOTE — ED Notes (Signed)
NAD noted at time of D/C. Pt denies questions or concerns. Pt ambulatory to the lobby at this time.  

## 2014-12-08 NOTE — ED Notes (Signed)
Pt to ed with c/o left foot pain and swelling.  Pt states she has been seen here twice for same.

## 2014-12-08 NOTE — Discharge Instructions (Signed)
Advised to follow up with Podiatry Clinic for definitve diagnosis and treatment.

## 2015-05-16 ENCOUNTER — Emergency Department
Admission: EM | Admit: 2015-05-16 | Discharge: 2015-05-16 | Disposition: A | Discharge status: Home / Self Care | Payer: Medicaid Other | Attending: Emergency Medicine | Admitting: Emergency Medicine

## 2015-05-16 ENCOUNTER — Encounter: Payer: Self-pay | Admitting: *Deleted

## 2015-05-16 DIAGNOSIS — Z791 Long term (current) use of non-steroidal anti-inflammatories (NSAID): Secondary | ICD-10-CM | POA: Insufficient documentation

## 2015-05-16 DIAGNOSIS — L239 Allergic contact dermatitis, unspecified cause: Secondary | ICD-10-CM

## 2015-05-16 DIAGNOSIS — I10 Essential (primary) hypertension: Secondary | ICD-10-CM | POA: Insufficient documentation

## 2015-05-16 DIAGNOSIS — Z87891 Personal history of nicotine dependence: Secondary | ICD-10-CM | POA: Insufficient documentation

## 2015-05-16 MED ORDER — DIPHENHYDRAMINE HCL 25 MG PO CAPS
50.0000 mg | ORAL_CAPSULE | Freq: Once | ORAL | Status: AC
  Administered 2015-05-16: 50 mg via ORAL
  Filled 2015-05-16: qty 2

## 2015-05-16 MED ORDER — PREDNISONE 20 MG PO TABS
60.0000 mg | ORAL_TABLET | Freq: Once | ORAL | Status: AC
  Administered 2015-05-16: 60 mg via ORAL
  Filled 2015-05-16: qty 3

## 2015-05-16 MED ORDER — PREDNISONE 10 MG (21) PO TBPK: ORAL_TABLET | ORAL | Status: DC

## 2015-05-16 NOTE — ED Notes (Signed)
Pt has itching rash for 5 days.  Reports rash all over body.  Pt has used cortisone cream without relief.  Pt alert.

## 2015-05-16 NOTE — ED Notes (Signed)
Pt discharged to home.  Family member driving.  Discharge instructions reviewed.  Verbalized understanding.  No questions or concerns at this time.  Teach back verified.  Pt in NAD.  No items left in ED.   

## 2015-05-16 NOTE — Discharge Instructions (Signed)
Continue Benadryl as needed for itching. Prednisone taper as directed. Follow-up with your physician or dermatology if not improving. Can also see an allergist. Return to the emergency room for any worsening symptoms.

## 2015-05-16 NOTE — ED Provider Notes (Signed)
[facility] Emergency Department Provider Note  ____________________________________________  Time seen: Approximately 9:38 PM  I have reviewed the triage vital signs and the nursing notes.   HISTORY  Chief Complaint Rash    HPI Carmen Turner is a 35 y.o. female who presents with a pruritic rash to her entire body for approximately 4-5 days. Has tried cortisone cream. Symptoms started after using a lotion. No fevers and chills. No congestion or cough. No abdominal pain. No lesions in her mouth. No known trigger other than the possible lotion. She has sensitive skin.   Past Medical History  Diagnosis Date  . Hypertension     There are no active problems to display for this patient.   Past Surgical History  Procedure Laterality Date  . Cholecystectomy    . Cesarean section      Current Outpatient Rx  Name  Route  Sig  Dispense  Refill  . naproxen (NAPROSYN) 500 MG tablet   Oral   Take 1 tablet (500 mg total) by mouth 2 (two) times daily with a meal.   30 tablet   0   . predniSONE (STERAPRED UNI-PAK 21 TAB) 10 MG (21) TBPK tablet      6 tablets on day 1, 5 tablets on day 2, 4 tablets on day 3, etc...   21 tablet   0   . traMADol (ULTRAM) 50 MG tablet   Oral   Take 1 tablet (50 mg total) by mouth every 6 (six) hours as needed.   12 tablet   0   . traMADol (ULTRAM) 50 MG tablet   Oral   Take 1 tablet (50 mg total) by mouth every 6 (six) hours as needed for moderate pain.   12 tablet   0     Allergies Ibuprofen  No family history on file.  Social History Social History  Substance Use Topics  . Smoking status: Former Games developer  . Smokeless tobacco: Never Used  . Alcohol Use: Yes    Review of Systems Constitutional: No fever/chills Eyes: No visual changes. ENT: No sore throat. Cardiovascular: Denies chest pain. Respiratory: Denies shortness of breath. Gastrointestinal: No abdominal pain.  No nausea, no vomiting.  No  diarrhea.  No constipation. Genitourinary: Negative for dysuria. Musculoskeletal: Negative for back pain. Skin: PER hpi Neurological: Negative for headaches, focal weakness or numbness. 10-point ROS otherwise negative.  ____________________________________________   PHYSICAL EXAM:  VITAL SIGNS: ED Triage Vitals  Enc Vitals Group     BP 05/16/15 2035 138/89 mmHg     Pulse Rate 05/16/15 2035 67     Resp 05/16/15 2035 20     Temp 05/16/15 2035 98.3 F (36.8 C)     Temp Source 05/16/15 2035 Oral     SpO2 05/16/15 2035 98 %     Weight 05/16/15 2035 170 lb (77.111 kg)     Height 05/16/15 2035  (1.6 m)     Head Cir --      Peak Flow --      Pain Score --      Pain Loc --      Pain Edu? --      Excl. in GC? --     Constitutional: Alert and oriented. Well appearing and in no acute distress. Eyes: Conjunctivae are normal. PERRL. EOMI. Ears:  Clear with normal landmarks. No erythema. Head: Atraumatic. Nose: No congestion/rhinnorhea. Mouth/Throat: Mucous membranes are moist.  Oropharynx non-erythematous. No lesions. Neck:  Supple.  No  adenopathy.   Cardiovascular: Normal rate, regular rhythm. Grossly normal heart sounds.  Good peripheral circulation. Respiratory: Normal respiratory effort.  No retractions. Lungs CTAB. Gastrointestinal: Soft and nontender. No distention. No abdominal bruits. No CVA tenderness. Musculoskeletal: Nml ROM of upper and lower extremity joints. Neurologic:  Normal speech and language. No gross focal neurologic deficits are appreciated. No gait instability. Skin:  Skin is warm, dry and intact. She has general erythema to the chest arms hands. No vesicular or tunneling lesions. No ulcerative lesions. Psychiatric: Mood and affect are normal. Speech and behavior are normal.  ____________________________________________   LABS (all labs ordered are listed, but only abnormal results are displayed)  Labs Reviewed - No data to  display ____________________________________________  EKG   ____________________________________________  RADIOLOGY   ____________________________________________   PROCEDURES  Procedure(s) performed: None  Critical Care performed: No  ____________________________________________   INITIAL IMPRESSION / ASSESSMENT AND PLAN / ED COURSE  Pertinent labs & imaging results that were available during my care of the patient were reviewed by me and considered in my medical decision making (see chart for details).  35 year old female with several days of pleuritic erythematous rash with unclear etiology. Suspect allergic dermatitis possibly from lotion. She has tried cortisone cream without relief. Encouraged Benadryl and given prednisone taper. She can follow-up with her physician or dermatology if not improving. ____________________________________________   FINAL CLINICAL IMPRESSION(S) / ED DIAGNOSES  Final diagnoses:  Allergic dermatitis      Ignacia BayleyRobert Tumey, PA-C 05/16/15 2143  Loleta Roseory Forbach, MD 05/17/15 1125

## 2015-11-04 ENCOUNTER — Other Ambulatory Visit: Payer: Self-pay

## 2015-11-06 ENCOUNTER — Encounter
Admission: RE | Admit: 2015-11-06 | Discharge: 2015-11-06 | Disposition: A | Discharge status: Home / Self Care | Payer: Medicaid Other | Source: Ambulatory Visit | Attending: Obstetrics & Gynecology | Admitting: Obstetrics & Gynecology

## 2015-11-06 ENCOUNTER — Encounter: Payer: Self-pay | Admitting: *Deleted

## 2015-11-06 HISTORY — DX: Anemia, unspecified: D64.9

## 2015-11-06 LAB — DIFFERENTIAL
Basophils Absolute: 0 10*3/uL (ref 0–0.1)
Basophils Relative: 0 %
Eosinophils Absolute: 0.1 10*3/uL (ref 0–0.7)
Eosinophils Relative: 1 %
Lymphocytes Relative: 20 %
Lymphs Abs: 1.4 10*3/uL (ref 1.0–3.6)
Monocytes Absolute: 0.5 10*3/uL (ref 0.2–0.9)
Monocytes Relative: 7 %
Neutro Abs: 5.3 10*3/uL (ref 1.4–6.5)
Neutrophils Relative %: 72 %

## 2015-11-06 LAB — CBC
HCT: 35.5 % (ref 35.0–47.0)
Hemoglobin: 12.3 g/dL (ref 12.0–16.0)
MCH: 30.4 pg (ref 26.0–34.0)
MCHC: 34.7 g/dL (ref 32.0–36.0)
MCV: 87.6 fL (ref 80.0–100.0)
Platelets: 215 10*3/uL (ref 150–440)
RBC: 4.05 MIL/uL (ref 3.80–5.20)
RDW: 13.7 % (ref 11.5–14.5)
WBC: 7.3 10*3/uL (ref 3.6–11.0)

## 2015-11-06 LAB — TYPE AND SCREEN
ABO/RH(D): A POS
Antibody Screen: NEGATIVE
Extend sample reason: UNDETERMINED

## 2015-11-06 NOTE — Patient Instructions (Signed)
  Your procedure is scheduled on November 07, 2015 (Thursday) Report to LABOR AND DELIVERY (THIRD FLOOR) To find out your arrival time please call [phone] between 1PM - 3PM on ARRIVAL TIME 12:00 (NOON)  Remember: Instructions that are not followed completely may result in serious medical risk, up to and including death, or upon the discretion of your surgeon and anesthesiologist your surgery may need to be rescheduled.    _x___ 1. Do not eat food or drink liquids after midnight. No gum chewing or hard candies.     _x__ 2. No Alcohol for 24 hours before or after surgery.   _x__3. No Smoking for 24 prior to surgery.   ____  4. Bring all medications with you on the day of surgery if instructed.    __x__ 5. Notify your doctor if there is any change in your medical condition     (cold, fever, infections).     Do not wear jewelry, make-up, hairpins, clips or nail polish.  Do not wear lotions, powders, or perfumes. You may wear deodorant.  Do not shave 48 hours prior to surgery. Men may shave face and neck.  Do not bring valuables to the hospital.    [facility] is not responsible for any belongings or valuables.               Contacts, dentures or bridgework may not be worn into surgery.  Leave your suitcase in the car. After surgery it may be brought to your room.  For patients admitted to the hospital, discharge time is determined by your treatment team.   Patients discharged the day of surgery will not be allowed to drive home.    Please read over the following fact sheets that you were given:   [facility] Preparing for Surgery and or MRSA Information   ___ Take these medicines the morning of surgery with A SIP OF WATER:    1.   2.  3.  4.  5.  6.  ____ Fleet Enema (as directed)   _x___ Use CHG Soap or sage wipes as directed on instruction sheet   ____ Use inhalers on the day of surgery and bring to hospital day of surgery  ____ Stop metformin 2 days prior to  surgery    ____ Take 1/2 of usual insulin dose the night before surgery and none on the morning of surgery          _x___ Stop aspirin or coumadin, or plavix (NO ASPIRIN)  _x__ Stop Anti-inflammatories such as Advil, Aleve, Ibuprofen, Motrin, Naproxen,          Naprosyn, Goodies powders or aspirin products. Ok to take Tylenol.   ____ Stop supplements until after surgery.    ____ Bring C-Pap to the hospital.

## 2015-11-07 ENCOUNTER — Encounter: Payer: Self-pay | Admitting: *Deleted

## 2015-11-07 ENCOUNTER — Encounter
Admission: RE | Disposition: A | Discharge status: Home / Self Care | Payer: Self-pay | Source: Ambulatory Visit | Attending: Obstetrics & Gynecology

## 2015-11-07 ENCOUNTER — Inpatient Hospital Stay
Admission: RE | Admit: 2015-11-07 | Discharge: 2015-11-09 | DRG: 765 | Disposition: A | Discharge status: Home / Self Care | Payer: Medicaid Other | Source: Ambulatory Visit | Attending: Obstetrics & Gynecology | Admitting: Obstetrics & Gynecology

## 2015-11-07 ENCOUNTER — Inpatient Hospital Stay: Payer: Medicaid Other | Admitting: Certified Registered"

## 2015-11-07 DIAGNOSIS — O9902 Anemia complicating childbirth: Secondary | ICD-10-CM | POA: Diagnosis present

## 2015-11-07 DIAGNOSIS — Z3A39 39 weeks gestation of pregnancy: Secondary | ICD-10-CM | POA: Diagnosis not present

## 2015-11-07 DIAGNOSIS — D62 Acute posthemorrhagic anemia: Secondary | ICD-10-CM | POA: Diagnosis present

## 2015-11-07 DIAGNOSIS — O34211 Maternal care for low transverse scar from previous cesarean delivery: Secondary | ICD-10-CM | POA: Diagnosis present

## 2015-11-07 DIAGNOSIS — Z302 Encounter for sterilization: Secondary | ICD-10-CM | POA: Diagnosis not present

## 2015-11-07 DIAGNOSIS — Z23 Encounter for immunization: Secondary | ICD-10-CM

## 2015-11-07 DIAGNOSIS — Z87891 Personal history of nicotine dependence: Secondary | ICD-10-CM

## 2015-11-07 DIAGNOSIS — Z8759 Personal history of other complications of pregnancy, childbirth and the puerperium: Secondary | ICD-10-CM

## 2015-11-07 LAB — HIV ANTIBODY (ROUTINE TESTING W REFLEX): HIV Screen 4th Generation wRfx: NONREACTIVE

## 2015-11-07 LAB — RPR: RPR Ser Ql: NONREACTIVE

## 2015-11-07 SURGERY — Surgical Case
Anesthesia: Spinal

## 2015-11-07 MED ORDER — SODIUM CHLORIDE 0.9% FLUSH: 3.0000 mL | INTRAVENOUS | Status: DC | PRN

## 2015-11-07 MED ORDER — SENNOSIDES-DOCUSATE SODIUM 8.6-50 MG PO TABS
2.0000 | ORAL_TABLET | ORAL | Status: DC
  Administered 2015-11-08: 2 via ORAL
  Filled 2015-11-07: qty 2

## 2015-11-07 MED ORDER — SIMETHICONE 80 MG PO CHEW
80.0000 mg | CHEWABLE_TABLET | ORAL | Status: DC
  Administered 2015-11-08: 80 mg via ORAL
  Filled 2015-11-07: qty 1

## 2015-11-07 MED ORDER — LACTATED RINGERS IV SOLN: INTRAVENOUS | Status: DC

## 2015-11-07 MED ORDER — MORPHINE SULFATE (PF) 0.5 MG/ML IJ SOLN
INTRAMUSCULAR | Status: DC | PRN
  Administered 2015-11-07: .2 mg via EPIDURAL

## 2015-11-07 MED ORDER — BUPIVACAINE IN DEXTROSE 0.75-8.25 % IT SOLN
INTRATHECAL | Status: DC | PRN
  Administered 2015-11-07: 1.8 mL via INTRATHECAL

## 2015-11-07 MED ORDER — BUPIVACAINE HCL (PF) 0.5 % IJ SOLN
INTRAMUSCULAR | Status: AC
  Filled 2015-11-07: qty 30

## 2015-11-07 MED ORDER — ONDANSETRON HCL 4 MG/2ML IJ SOLN
INTRAMUSCULAR | Status: DC | PRN
Start: 2015-11-07 — End: 2015-11-07
  Administered 2015-11-07: 8 mg via INTRAVENOUS

## 2015-11-07 MED ORDER — ACETAMINOPHEN 325 MG PO TABS: 650.0000 mg | ORAL_TABLET | ORAL | Status: DC | PRN

## 2015-11-07 MED ORDER — DIPHENHYDRAMINE HCL 50 MG/ML IJ SOLN: 12.5000 mg | INTRAMUSCULAR | Status: DC | PRN

## 2015-11-07 MED ORDER — LACTATED RINGERS IV SOLN
INTRAVENOUS | Status: DC
  Administered 2015-11-07 (×2): via INTRAVENOUS

## 2015-11-07 MED ORDER — OXYTOCIN 40 UNITS IN LACTATED RINGERS INFUSION - SIMPLE MED
INTRAVENOUS | Status: DC | PRN
  Administered 2015-11-07: 600 mL via INTRAVENOUS

## 2015-11-07 MED ORDER — BUPIVACAINE HCL 0.5 % IJ SOLN
10.0000 mL | Freq: Once | INTRAMUSCULAR | Status: DC
  Filled 2015-11-07: qty 10

## 2015-11-07 MED ORDER — NALBUPHINE HCL 10 MG/ML IJ SOLN: 5.0000 mg | INTRAMUSCULAR | Status: DC | PRN

## 2015-11-07 MED ORDER — PRENATAL MULTIVITAMIN CH
1.0000 | ORAL_TABLET | Freq: Every day | ORAL | Status: DC
  Administered 2015-11-08 – 2015-11-09 (×2): 1 via ORAL
  Filled 2015-11-07 (×2): qty 1

## 2015-11-07 MED ORDER — MENTHOL 3 MG MT LOZG
1.0000 | LOZENGE | OROMUCOSAL | Status: DC | PRN
  Filled 2015-11-07: qty 9

## 2015-11-07 MED ORDER — BUPIVACAINE 0.25 % ON-Q PUMP DUAL CATH 400 ML
400.0000 mL | INJECTION | Status: DC
  Filled 2015-11-07 (×3): qty 400

## 2015-11-07 MED ORDER — SCOPOLAMINE 1 MG/3DAYS TD PT72: 1.0000 | MEDICATED_PATCH | Freq: Once | TRANSDERMAL | Status: DC

## 2015-11-07 MED ORDER — KETOROLAC TROMETHAMINE 30 MG/ML IJ SOLN: 30.0000 mg | Freq: Four times a day (QID) | INTRAMUSCULAR | Status: AC

## 2015-11-07 MED ORDER — MEPERIDINE HCL 25 MG/ML IJ SOLN: 6.2500 mg | INTRAMUSCULAR | Status: DC | PRN

## 2015-11-07 MED ORDER — LACTATED RINGERS IV SOLN
Freq: Once | INTRAVENOUS | Status: AC
  Administered 2015-11-07: 1000 mL via INTRAVENOUS

## 2015-11-07 MED ORDER — CEFOXITIN SODIUM-DEXTROSE 2-2.2 GM-% IV SOLR (PREMIX)
2.0000 g | Freq: Four times a day (QID) | INTRAVENOUS | Status: DC
  Administered 2015-11-07: 2000 mg via INTRAVENOUS
  Filled 2015-11-07 (×5): qty 50

## 2015-11-07 MED ORDER — MORPHINE SULFATE (PF) 2 MG/ML IV SOLN: 1.0000 mg | INTRAVENOUS | Status: DC | PRN

## 2015-11-07 MED ORDER — DIPHENHYDRAMINE HCL 25 MG PO CAPS: 25.0000 mg | ORAL_CAPSULE | Freq: Four times a day (QID) | ORAL | Status: DC | PRN

## 2015-11-07 MED ORDER — DIBUCAINE 1 % RE OINT: 1.0000 "application " | TOPICAL_OINTMENT | RECTAL | Status: DC | PRN

## 2015-11-07 MED ORDER — FENTANYL CITRATE (PF) 100 MCG/2ML IJ SOLN: 25.0000 ug | INTRAMUSCULAR | Status: DC | PRN

## 2015-11-07 MED ORDER — OXYCODONE-ACETAMINOPHEN 5-325 MG PO TABS
2.0000 | ORAL_TABLET | ORAL | Status: DC | PRN
  Administered 2015-11-08 – 2015-11-09 (×5): 2 via ORAL
  Filled 2015-11-07 (×5): qty 2

## 2015-11-07 MED ORDER — NALBUPHINE HCL 10 MG/ML IJ SOLN: 5.0000 mg | Freq: Once | INTRAMUSCULAR | Status: DC | PRN

## 2015-11-07 MED ORDER — IBUPROFEN 600 MG PO TABS
600.0000 mg | ORAL_TABLET | Freq: Four times a day (QID) | ORAL | Status: DC | PRN
  Filled 2015-11-07: qty 1

## 2015-11-07 MED ORDER — SIMETHICONE 80 MG PO CHEW
80.0000 mg | CHEWABLE_TABLET | Freq: Three times a day (TID) | ORAL | Status: DC
  Administered 2015-11-07 – 2015-11-09 (×5): 80 mg via ORAL
  Filled 2015-11-07 (×5): qty 1

## 2015-11-07 MED ORDER — PHENYLEPHRINE HCL 10 MG/ML IJ SOLN
INTRAMUSCULAR | Status: DC | PRN
  Administered 2015-11-07: 200 ug via INTRAVENOUS
  Administered 2015-11-07: 100 ug via INTRAVENOUS

## 2015-11-07 MED ORDER — ONDANSETRON HCL 4 MG/2ML IJ SOLN: 4.0000 mg | Freq: Three times a day (TID) | INTRAMUSCULAR | Status: DC | PRN

## 2015-11-07 MED ORDER — ZOLPIDEM TARTRATE 5 MG PO TABS: 5.0000 mg | ORAL_TABLET | Freq: Every evening | ORAL | Status: DC | PRN

## 2015-11-07 MED ORDER — CALCIUM CARBONATE ANTACID 500 MG PO CHEW: 2.0000 | CHEWABLE_TABLET | ORAL | Status: DC | PRN

## 2015-11-07 MED ORDER — COCONUT OIL OIL: 1.0000 "application " | TOPICAL_OIL | Status: DC | PRN

## 2015-11-07 MED ORDER — NALOXONE HCL 2 MG/2ML IJ SOSY
1.0000 ug/kg/h | PREFILLED_SYRINGE | INTRAVENOUS | Status: DC | PRN
  Filled 2015-11-07: qty 2

## 2015-11-07 MED ORDER — TETANUS-DIPHTH-ACELL PERTUSSIS 5-2.5-18.5 LF-MCG/0.5 IM SUSP
0.5000 mL | Freq: Once | INTRAMUSCULAR | Status: AC
  Administered 2015-11-09: 0.5 mL via INTRAMUSCULAR
  Filled 2015-11-07: qty 0.5

## 2015-11-07 MED ORDER — OXYTOCIN 40 UNITS IN LACTATED RINGERS INFUSION - SIMPLE MED
2.5000 [IU]/h | INTRAVENOUS | Status: AC
  Filled 2015-11-07: qty 1000

## 2015-11-07 MED ORDER — LACTATED RINGERS IV SOLN
INTRAVENOUS | Status: DC
  Administered 2015-11-07 – 2015-11-08 (×3): via INTRAVENOUS

## 2015-11-07 MED ORDER — WITCH HAZEL-GLYCERIN EX PADS: 1.0000 "application " | MEDICATED_PAD | CUTANEOUS | Status: DC | PRN

## 2015-11-07 MED ORDER — SIMETHICONE 80 MG PO CHEW
80.0000 mg | CHEWABLE_TABLET | ORAL | Status: DC | PRN
  Filled 2015-11-07: qty 1

## 2015-11-07 MED ORDER — ONDANSETRON HCL 4 MG/2ML IJ SOLN: 4.0000 mg | Freq: Once | INTRAMUSCULAR | Status: DC | PRN

## 2015-11-07 MED ORDER — OXYCODONE-ACETAMINOPHEN 5-325 MG PO TABS
1.0000 | ORAL_TABLET | ORAL | Status: DC | PRN
  Administered 2015-11-07 – 2015-11-08 (×3): 1 via ORAL
  Filled 2015-11-07 (×3): qty 1

## 2015-11-07 MED ORDER — SOD CITRATE-CITRIC ACID 500-334 MG/5ML PO SOLN
30.0000 mL | Freq: Once | ORAL | Status: AC
  Administered 2015-11-07: 30 mL via ORAL
  Filled 2015-11-07: qty 30

## 2015-11-07 MED ORDER — DIPHENHYDRAMINE HCL 25 MG PO CAPS: 25.0000 mg | ORAL_CAPSULE | ORAL | Status: DC | PRN

## 2015-11-07 MED ORDER — BUPIVACAINE HCL 0.25 % IJ SOLN
INTRAMUSCULAR | Status: DC | PRN
  Administered 2015-11-07: 10 mL

## 2015-11-07 MED ORDER — NALOXONE HCL 0.4 MG/ML IJ SOLN: 0.4000 mg | INTRAMUSCULAR | Status: DC | PRN

## 2015-11-07 SURGICAL SUPPLY — 23 items
CANISTER SUCT 3000ML (MISCELLANEOUS) ×2 IMPLANT
CATH KIT ON-Q SILVERSOAK 5IN (CATHETERS) ×4 IMPLANT
CHLORAPREP W/TINT 26ML (MISCELLANEOUS) ×4 IMPLANT
DRSG OPSITE POSTOP 4X10 (GAUZE/BANDAGES/DRESSINGS) ×2 IMPLANT
ELECT CAUTERY BLADE 6.4 (BLADE) ×2 IMPLANT
ELECT REM PT RETURN 9FT ADLT (ELECTROSURGICAL) ×2
ELECTRODE REM PT RTRN 9FT ADLT (ELECTROSURGICAL) ×1 IMPLANT
GLOVE SKINSENSE NS SZ8.0 LF (GLOVE) ×1
GLOVE SKINSENSE STRL SZ8.0 LF (GLOVE) ×1 IMPLANT
GOWN STRL REUS W/ TWL LRG LVL3 (GOWN DISPOSABLE) ×1 IMPLANT
GOWN STRL REUS W/ TWL XL LVL3 (GOWN DISPOSABLE) ×2 IMPLANT
GOWN STRL REUS W/TWL LRG LVL3 (GOWN DISPOSABLE) ×1
GOWN STRL REUS W/TWL XL LVL3 (GOWN DISPOSABLE) ×2
LIQUID BAND (GAUZE/BANDAGES/DRESSINGS) ×2 IMPLANT
NS IRRIG 1000ML POUR BTL (IV SOLUTION) ×2 IMPLANT
PACK C SECTION AR (MISCELLANEOUS) ×2 IMPLANT
PAD OB MATERNITY 4.3X12.25 (PERSONAL CARE ITEMS) ×2 IMPLANT
PAD PREP 24X41 OB/GYN DISP (PERSONAL CARE ITEMS) ×2 IMPLANT
SUT MAXON ABS #0 GS21 30IN (SUTURE) ×4 IMPLANT
SUT VIC AB 0 CT1 36 (SUTURE) ×2 IMPLANT
SUT VIC AB 1 CT1 36 (SUTURE) ×6 IMPLANT
SUT VIC AB 2-0 CT1 36 (SUTURE) ×2 IMPLANT
SUT VIC AB 4-0 FS2 27 (SUTURE) ×2 IMPLANT

## 2015-11-07 NOTE — Discharge Summary (Signed)
Obstetrical Discharge Summary  Date of Admission: 11/07/2015 Date of Discharge: 11/09/2015  Discharge Diagnosis: Term Pregnancy-delivered Primary OB:  [facility]   Gestational Age at Delivery: 1342w3d  Antepartum complications: Limited PNC Date of Delivery: 11/09/2015  Delivered By: Annamarie MajorPaul Harris, MD Delivery Type: repeat cesarean section, low transverse incision and also tubal ligation Intrapartum complications/course: None Anesthesia: spinal Placenta: manual removal Laceration: none Episiotomy: none Live born female  Birth Weight: 7 lb 9.3 oz (3440 g) APGAR: 9, 9   Post partum course: Since the delivery, patient has tolerate activity, diet, and daily functions without difficulty or complication.  Min lochia.  No breast concerns at this time.  No signs of depression currently.   BP 128/86 (BP Location: Right Arm)   Pulse 72   Temp 97.6 F (36.4 C) (Oral)   Resp 18   Ht 5\' 3"  (1.6 m)   Wt 165 lb (74.8 kg)   LMP 02/04/2015   SpO2 97%   BMI 29.23 kg/m   Postpartum Exam:General appearance: alert, cooperative and no distress GI: soft, non-tender; bowel sounds normal; no masses,  no organomegaly and Fundus firm Extremities: extremities normal, atraumatic, no cyanosis or edema  Disposition: home with infant Rh Immune globulin given: not applicable Rubella vaccine given: yes Varicella vaccine given: no Tdap vaccine given in AP or PP setting: PP prior to discharge Flu vaccine given in AP or PP setting: no Contraception: bilateral tubal ligation  Prenatal Labs: A POS//Rubella Not immune//RPR negative//HIV negative/HepB Surface Ag negative//plans to breastfeed  Plan:  Carmen Turner was discharged to home in good condition. [facility] in 1 week  Discharge Medications:   Medication List    STOP taking these medications   naproxen 500 MG tablet Commonly known as:  NAPROSYN   predniSONE 10 MG (21) Tbpk tablet Commonly known as:  STERAPRED UNI-PAK  21 TAB   traMADol 50 MG tablet Commonly known as:  ULTRAM     TAKE these medications   oxyCODONE-acetaminophen 5-325 MG tablet Commonly known as:  PERCOCET/ROXICET Take 2 tablets by mouth every 4 (four) hours as needed (pain scale > 7).       Follow-up arrangements:  Follow-up Information    Carmen Libraobert Paul Harris, MD. Go in 1 week.   Specialty:  Obstetrics and Gynecology Why:  For Post Op, As Scheduled Contact information: [address] [place] [address] [phone]          Carmen MohairStephen Jackson, MD 11/09/2015 10:45 AM

## 2015-11-07 NOTE — H&P (Signed)
History and Physical Interval Note:  11/07/2015 1:22 PM  Carmen FeeKelly A Hallam  has presented today for surgery, with the diagnosis of prior c section  The various methods of treatment have been discussed with the patient and family. After consideration of risks, benefits and other options for treatment, the patient has consented to  Procedure(s): CESAREAN SECTION WITH BILATERAL TUBAL LIGATION (N/A) as a surgical intervention .  The patient's history has been reviewed, patient examined, no change in status, stable for surgery.  Pt has the following beta blocker history-  Not taking Beta Blocker.  I have reviewed the patient's chart and labs.  Questions were answered to the patient's satisfaction.       [name]

## 2015-11-07 NOTE — Op Note (Signed)
Cesarean Section Procedure Note Indications: prior cesarean section and term intrauterine pregnancy, Desire for permanent sterility  Pre-operative Diagnosis: Intrauterine pregnancy 6259w3d ;  prior cesarean section and term intrauterine pregnancy, Desire for permanent sterility Post-operative Diagnosis: same, delivered. Procedure: Low Transverse Cesarean Section, Bilateral Tubal Ligation Surgeon: Annamarie MajorPaul Harris, MD Assistant(s): Channing MuttersLori Brame Anesthesia: Spinal anesthesia Estimated Blood Loss:300  Complications: None; patient tolerated the procedure well. Disposition: PACU - hemodynamically stable. Condition: stable  Findings: A female infant in the cephalic presentation. Amniotic fluid - Clear  Birth weight 7-9 lbs.  Apgars of 9 and 9.  Intact placenta with a three-vessel cord. Grossly normal uterus, tubes and ovaries bilaterally. No intraabdominal adhesions were noted.  Procedure Details   The patient was taken to Operating Room, identified as the correct patient and the procedure verified as C-Section Delivery. A Time Out was held and the above information confirmed. After induction of anesthesia, the patient was draped and prepped in the usual sterile manner. A Pfannenstiel incision was made and carried down through the subcutaneous tissue to the fascia. Fascial incision was made and extended transversely with the Mayo scissors. The fascia was separated from the underlying rectus tissue superiorly and inferiorly. The peritoneum was identified and entered bluntly. Peritoneal incision was extended longitudinally. The utero-vesical peritoneal reflection was incised transversely and a bladder flap was created digitally.  A low transverse hysterotomy was made. The fetus was delivered atraumatically. The umbilical cord was clamped x2 and cut and the infant was handed to the awaiting pediatricians. The placenta was removed intact and appeared normal with a 3-vessel cord.  The uterus was exteriorized  and cleared of all clot and debris. The hysterotomy was closed with running sutures of 0 Vicryl suture. A second imbricating layer was placed with the same suture. Excellent hemostasis was observed.   The left Fallopian tube was identified, grasped with the Babcock clamps, lifted to the skin incision and followed out distally to the fimbriae. An avascular midsection of the tube approximately 3-4cm from the cornua was grasped with the babcock clamps and brought into a knuckle at the skin incision. The tube was double ligated with 2-0 Vicryl suture and the intervening portion of tube was transected and removed. Excellent hemostasis was noted and the tube was returned to the abdomen. Attention was then turned to the right fallopian tube after confirmation of identification by tracing the tube out to the fimbriae. The same procedure was then performed on the right Fallopian tube. Again, excellent hemostasis was noted at the end of the procedure.  The uterus was returned to the abdomen. The pelvis was irrigated and again, excellent hemostasis was noted.  The On Q Pain pump System was then placed.  Trocars were placed through the abdominal wall into the subfascial space and these were used to thread the silver soaker cathaters into place.The rectus fascia was then reapproximated with running sutures of Maxon, with careful placement not to incorporate the cathaters. Subcutaneous tissues are then irrigated with saline and hemostasis assured.  Skin is then closed with 4-0 vicryl suture in a subcuticular fashion followed by skin adhesive. The cathaters are flushed each with 5 mL of Bupivicaine and stabilized into place with dressing. Instrument, sponge, and needle counts were correct prior to the abdominal closure and at the conclusion of the case.  The patient tolerated the procedure well and was transferred to the recovery room in stable condition.

## 2015-11-07 NOTE — Anesthesia Preprocedure Evaluation (Signed)
Anesthesia Evaluation  Patient identified by MRN, date of birth, ID band Patient awake    Reviewed: Allergy & Precautions, NPO status , Patient's Chart, lab work & pertinent test results  History of Anesthesia Complications Negative for: history of anesthetic complications  Airway Mallampati: II       Dental  (+) Teeth Intact   Pulmonary neg pulmonary ROS, former smoker,    Pulmonary exam normal        Cardiovascular hypertension, negative cardio ROS   Rhythm:Regular Rate:Normal     Neuro/Psych    GI/Hepatic negative GI ROS, Neg liver ROS,   Endo/Other  negative endocrine ROS  Renal/GU negative Renal ROS     Musculoskeletal   Abdominal Normal abdominal exam  (+)   Peds negative pediatric ROS (+)  Hematology negative hematology ROS (+) anemia ,   Anesthesia Other Findings   Reproductive/Obstetrics                             Anesthesia Physical Anesthesia Plan  ASA: II  Anesthesia Plan: Spinal   Post-op Pain Management:    Induction: Intravenous  Airway Management Planned: Natural Airway and Nasal Cannula  Additional Equipment:   Intra-op Plan:   Post-operative Plan:   Informed Consent: I have reviewed the patients History and Physical, chart, labs and discussed the procedure including the risks, benefits and alternatives for the proposed anesthesia with the patient or authorized representative who has indicated his/her understanding and acceptance.     Plan Discussed with: CRNA  Anesthesia Plan Comments:         Anesthesia Quick Evaluation

## 2015-11-07 NOTE — Transfer of Care (Signed)
Immediate Anesthesia Transfer of Care Note  Patient: Carmen Turner A Lachman  Procedure(s) Performed: Procedure(s): CESAREAN SECTION WITH BILATERAL TUBAL LIGATION (N/A)  Patient Location: PACU  Anesthesia Type:Spinal  Level of Consciousness: awake, alert , oriented and patient cooperative  Airway & Oxygen Therapy: Patient Spontanous Breathing  Post-op Assessment: Report given to RN, Post -op Vital signs reviewed and stable and Patient moving all extremities X 4  Post vital signs: Reviewed and stable  Last Vitals:  Vitals:   11/07/15 1339 11/07/15 1510  BP: 127/89 129/87  Pulse: 87 97  Resp:  19  Temp:  36.3 C    Last Pain:  Vitals:   11/07/15 1510  TempSrc: Oral  PainSc: 0-No pain         Complications: No apparent anesthesia complications

## 2015-11-07 NOTE — Consult Note (Signed)
Neonatology Note:   Attendance at C-section:    I was asked by Dr. Harris to attend this repeat C/S at term. The mother is a G5P4 A pos, GBS pos with chronic HTN, not on medication. HIV neg, RPR neg, Hep B status not found on chart. ROM at delivery, fluid clear. Infant vigorous with good spontaneous cry and tone. Delayed cord clamping done for 45 seconds. Needed only minimal bulb suctioning. Ap 9/9. Lungs clear to ausc in DR. To CN to care of Pediatrician.  I asked transition nurse to get mother's Hep B status.   Christie C. DaVanzo, MD 

## 2015-11-07 NOTE — Anesthesia Procedure Notes (Signed)
Spinal  Start time: 11/07/2015 1:55 PM End time: 11/07/2015 2:03 PM Staffing Anesthesiologist: Elijio MilesVAN STAVEREN, GIJSBERTUS F Performed: anesthesiologist  Preanesthetic Checklist Completed: patient identified, site marked, surgical consent, pre-op evaluation, timeout performed, IV checked, risks and benefits discussed and monitors and equipment checked Spinal Block Patient position: sitting Prep: Betadine Patient monitoring: heart rate Approach: midline Location: L4-5 Injection technique: single-shot Needle Needle type: Quincke  Needle gauge: 25 G Needle length: 9 cm Needle insertion depth: 6 cm Assessment Sensory level: T6

## 2015-11-08 ENCOUNTER — Encounter: Payer: Self-pay | Admitting: Obstetrics & Gynecology

## 2015-11-08 LAB — CBC
HCT: 28.3 % — ABNORMAL LOW (ref 35.0–47.0)
Hemoglobin: 9.9 g/dL — ABNORMAL LOW (ref 12.0–16.0)
MCH: 30.7 pg (ref 26.0–34.0)
MCHC: 35 g/dL (ref 32.0–36.0)
MCV: 87.7 fL (ref 80.0–100.0)
Platelets: 191 10*3/uL (ref 150–440)
RBC: 3.23 MIL/uL — ABNORMAL LOW (ref 3.80–5.20)
RDW: 13.6 % (ref 11.5–14.5)
WBC: 7.1 10*3/uL (ref 3.6–11.0)

## 2015-11-08 MED ORDER — FERROUS FUMARATE 324 (106 FE) MG PO TABS
1.0000 | ORAL_TABLET | Freq: Every day | ORAL | Status: DC
  Administered 2015-11-08 – 2015-11-09 (×2): 106 mg via ORAL
  Filled 2015-11-08 (×2): qty 1

## 2015-11-08 MED ORDER — DOCUSATE SODIUM 100 MG PO CAPS
100.0000 mg | ORAL_CAPSULE | Freq: Every day | ORAL | Status: DC
  Administered 2015-11-08 – 2015-11-09 (×2): 100 mg via ORAL
  Filled 2015-11-08 (×2): qty 1

## 2015-11-08 NOTE — Anesthesia Post-op Follow-up Note (Deleted)
  Anesthesia Pain Follow-up Note  Patient: Carmen Turner  Day #: 1  Date of Follow-up: 11/08/2015 Time: 8:51 AM  Last Vitals:  Vitals:   11/08/15 0332 11/08/15 0756  BP: 108/74 124/82  Pulse: 77 75  Resp: 18 18  Temp: 36.9 C 36.6 C    Level of Consciousness: alert  Pain: 4 /10   Side Effects:None  Catheter Site Exam:clean, dry, no drainage     Plan: D/C from anesthesia care  [name],  Linda A

## 2015-11-08 NOTE — Progress Notes (Addendum)
POD #1 repeat CS and BTL Subjective:   Doing well. Hungry. Has kept down liquids and crackers. Bottle feeding. Pain controlled well.  Objective:  Blood pressure 124/82, pulse 75, temperature 97.9 F (36.6 C), temperature source Oral, resp. rate 18, height 5\' 3"  (1.6 m), weight 74.8 kg (165 lb), last menstrual period 02/04/2015, SpO2 96 %. Urine output; clear urine, 1350 U/O General: NAD Pulmonary: no increased work of breathing/ CTAB Heart: RRR without murmur Abdomen: non-distended, non-tender, BS present Incision: honey comb dressing C+D+I, ON Q intact Extremities: SCDs on  Results for orders placed or performed during the hospital encounter of 11/07/15 (from the past 72 hour(s))  CBC     Status: Abnormal   Collection Time: 11/08/15  5:29 AM  Result Value Ref Range   WBC 7.1 3.6 - 11.0 K/uL   RBC 3.23 (L) 3.80 - 5.20 MIL/uL   Hemoglobin 9.9 (L) 12.0 - 16.0 g/dL   HCT 16.128.3 (L) 09.635.0 - 04.547.0 %   MCV 87.7 80.0 - 100.0 fL   MCH 30.7 26.0 - 34.0 pg   MCHC 35.0 32.0 - 36.0 g/dL   RDW 40.913.6 81.111.5 - 91.414.5 %   Platelets 191 150 - 440 K/uL     Assessment:   35 y.o. [id number] postoperativeday # 1-stable  Remove foley this afternoon-then trial of voiding  Ambulate when foley removed  Advance diet as tolerated   Plan:  1) Acute blood loss anemia - hemodynamically stable and asymptomatic - po iron and vitamins  2) --/--/A POS (09/13 1140) /   / Varicella Immune/ RNI  3) TDAP -not given antenatally  4) Bottle/ BTL  5) Disposition-home probably on POD#3  GUTIERREZ, COLLEEN, CNM

## 2015-11-08 NOTE — Anesthesia Post-op Follow-up Note (Signed)
  Anesthesia Pain Follow-up Note  Patient: Carmen Turner  Day #: 1  Date of Follow-up: 11/08/2015 Time: 9:49 AM  Last Vitals:  Vitals:   11/08/15 0332 11/08/15 0756  BP: 108/74 124/82  Pulse: 77 75  Resp: 18 18  Temp: 36.9 C 36.6 C    Level of Consciousness: alert  Pain: 4 /10   Side Effects:None  Catheter Site Exam:clean, dry, no drainage     Plan: D/C from anesthesia care  [name],  Linda A

## 2015-11-08 NOTE — Anesthesia Postprocedure Evaluation (Signed)
Anesthesia Post Note  Patient: Carmen Turner  Procedure(s) Performed: Procedure(s) (LRB): CESAREAN SECTION WITH BILATERAL TUBAL LIGATION (N/A)  Patient location during evaluation: Mother Baby Anesthesia Type: Spinal Level of consciousness: oriented and awake and alert Pain management: pain level controlled Vital Signs Assessment: post-procedure vital signs reviewed and stable Respiratory status: spontaneous breathing, respiratory function stable and patient connected to nasal cannula oxygen Cardiovascular status: blood pressure returned to baseline and stable Postop Assessment: no headache, no backache, no signs of nausea or vomiting, patient able to bend at knees and adequate PO intake Anesthetic complications: no    Last Vitals:  Vitals:   11/08/15 0332 11/08/15 0756  BP: 108/74 124/82  Pulse: 77 75  Resp: 18 18  Temp: 36.9 C 36.6 C    Last Pain:  Vitals:   11/08/15 0756  TempSrc: Oral  PainSc:                  [name],  Linda A

## 2015-11-09 MED ORDER — INFLUENZA VAC SPLIT QUAD 0.5 ML IM SUSY
0.5000 mL | PREFILLED_SYRINGE | Freq: Once | INTRAMUSCULAR | Status: AC
  Administered 2015-11-09: 0.5 mL via INTRAMUSCULAR
  Filled 2015-11-09: qty 0.5

## 2015-11-09 MED ORDER — MEASLES, MUMPS & RUBELLA VAC ~~LOC~~ INJ
0.5000 mL | INJECTION | SUBCUTANEOUS | Status: AC | PRN
  Administered 2015-11-09: 0.5 mL via SUBCUTANEOUS
  Filled 2015-11-09: qty 0.5

## 2015-11-09 MED ORDER — OXYCODONE-ACETAMINOPHEN 5-325 MG PO TABS: 2.0000 | ORAL_TABLET | ORAL | 0 refills | Status: DC | PRN

## 2015-11-09 MED ORDER — TETANUS-DIPHTH-ACELL PERTUSSIS 5-2.5-18.5 LF-MCG/0.5 IM SUSP: 0.5000 mL | INTRAMUSCULAR | Status: DC | PRN

## 2015-11-09 NOTE — Progress Notes (Signed)
Discharge instructions provided.  Pt and sig other verbalize understanding of all instructions and follow-up care.  Prescription given.  Pt discharged to home with infant at 1658 on 11/09/15 via wheelchair by CNA. Reynold BowenSusan Paisley Hedrick, RN 11/09/2015 8:28 PM

## 2015-11-09 NOTE — Discharge Instructions (Signed)
Cesarean Delivery, Care After °Refer to this sheet in the next few weeks. These instructions provide you with information on caring for yourself after your procedure. Your health care provider may also give you specific instructions. Your treatment has been planned according to current medical practices, but problems sometimes occur. Call your health care provider if you have any problems or questions after you go home. °HOME CARE INSTRUCTIONS  °· Only take over-the-counter or prescription medications as directed by your health care provider. °· Do not drink alcohol, especially if you are breastfeeding or taking medication to relieve pain. °· Do not chew or smoke tobacco. °· Continue to use good perineal care. Good perineal care includes: °¨ Wiping your perineum from front to back. °¨ Keeping your perineum clean. °· Check your surgical cut (incision) daily for increased redness, drainage, swelling, or separation of skin. °· Clean your incision gently with soap and water every day, and then pat it dry. If your health care provider says it is okay, leave the incision uncovered. Use a bandage (dressing) if the incision is draining fluid or appears irritated. If the adhesive strips across the incision do not fall off within 7 days, carefully peel them off. °· Hug a pillow when coughing or sneezing until your incision is healed. This helps to relieve pain. °· Do not use tampons or douche until your health care provider says it is okay. °· Shower, wash your hair, and take tub baths as directed by your health care provider. °· Wear a well-fitting bra that provides breast support. °· Limit wearing support panties or control-top hose. °· Drink enough fluids to keep your urine clear or pale yellow. °· Eat high-fiber foods such as whole grain cereals and breads, brown rice, beans, and fresh fruits and vegetables every day. These foods may help prevent or relieve constipation. °· Resume activities such as climbing stairs,  driving, lifting, exercising, or traveling as directed by your health care provider. °· Talk to your health care provider about resuming sexual activities. This is dependent upon your risk of infection, your rate of healing, and your comfort and desire to resume sexual activity. °· Try to have someone help you with your household activities and your newborn for at least a few days after you leave the hospital. °· Rest as much as possible. Try to rest or take a nap when your newborn is sleeping. °· Increase your activities gradually. °· Keep all of your scheduled postpartum appointments. It is very important to keep your scheduled follow-up appointments. At these appointments, your health care provider will be checking to make sure that you are healing physically and emotionally. °SEEK MEDICAL CARE IF:  °· You are passing large clots from your vagina. Save any clots to show your health care provider. °· You have a foul smelling discharge from your vagina. °· You have trouble urinating. °· You are urinating frequently. °· You have pain when you urinate. °· You have a change in your bowel movements. °· You have increasing redness, pain, or swelling near your incision. °· You have pus draining from your incision. °· Your incision is separating. °· You have painful, hard, or reddened breasts. °· You have a severe headache. °· You have blurred vision or see spots. °· You feel sad or depressed. °· You have thoughts of hurting yourself or your newborn. °· You have questions about your care, the care of your newborn, or medications. °· You are dizzy or light-headed. °· You have a rash. °· You   have pain, redness, or swelling at the site of the removed intravenous access (IV) tube. °· You have nausea or vomiting. °· You stopped breastfeeding and have not had a menstrual period within 12 weeks of stopping. °· You are not breastfeeding and have not had a menstrual period within 12 weeks of delivery. °· You have a fever. °SEEK  IMMEDIATE MEDICAL CARE IF: °· You have persistent pain. °· You have chest pain. °· You have shortness of breath. °· You faint. °· You have leg pain. °· You have stomach pain. °· Your vaginal bleeding saturates 2 or more sanitary pads in 1 hour. °MAKE SURE YOU:  °· Understand these instructions. °· Will watch your condition. °· Will get help right away if you are not doing well or get worse. °  °This information is not intended to replace advice given to you by your health care provider. Make sure you discuss any questions you have with your health care provider. °  °Document Released: 11/01/2001 Document Revised: 03/02/2014 Document Reviewed: 10/07/2011 °Elsevier Interactive Patient Education ©2016 [employer] ° °Call your doctor for increased pain or vaginal bleeding, temperature above 100.4, depression, or concerns.  No strenuous activity or heavy lifting for 6 weeks.  No intercourse, tampons, douching, or enemas for 6 weeks.  No tub baths-showers only.  No driving for 2 weeks or while taking pain medications.  Continue prenatal vitamin and iron.  Keep incision clean and dry.  Call your doctor for incision concerns including redness, swelling, bleeding or drainage, or if begins to come apart.  Increase calories and fluids while breastfeeding. ° °

## 2015-11-11 LAB — SURGICAL PATHOLOGY

## 2016-05-16 IMAGING — US US EXTREM LOW VENOUS*L*
1 series · 14 of 24 positions shown · non-contrast
Comparison: None

CLINICAL DATA: Swelling and pain x2 weeks

EXAM:
LEFT LOWER EXTREMITY VENOUS DOPPLER ULTRASOUND
TECHNIQUE: Gray-scale sonography with compression, as well as color and duplex
ultrasound, were performed to evaluate the deep venous system from
the level of the common femoral vein through the popliteal and
proximal calf veins.

[Series 1: us extrem low venous*left* · 0.09mm/px · 14 of 30 slices shown]
[im 1/30]
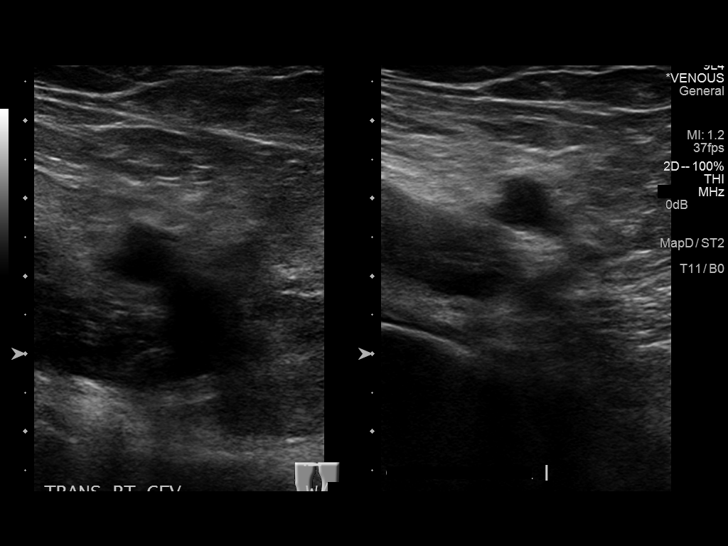
[im 3/30]
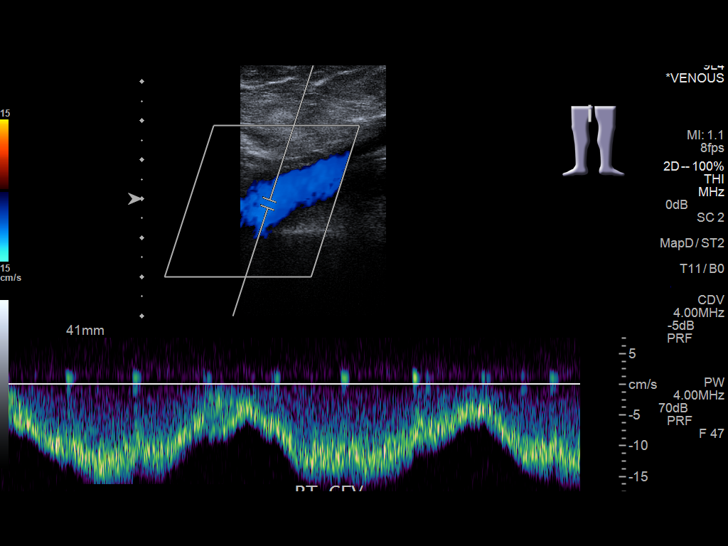
[im 6/30]
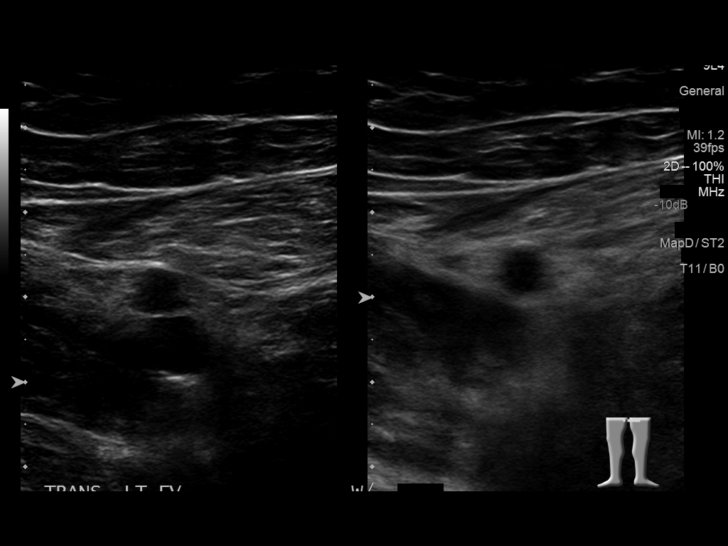
[im 8/30]
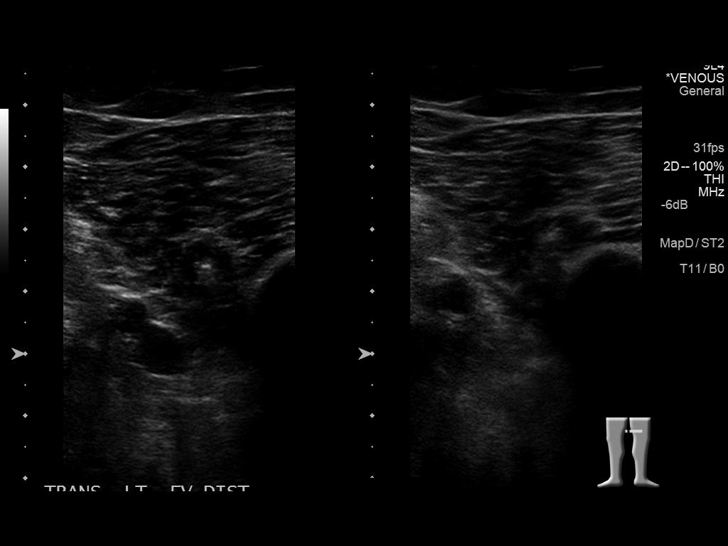
[im 9/30]
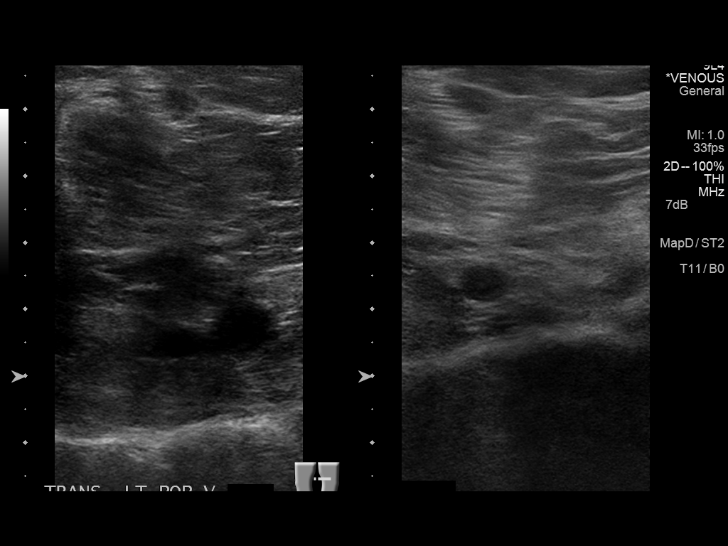
[im 12/30]
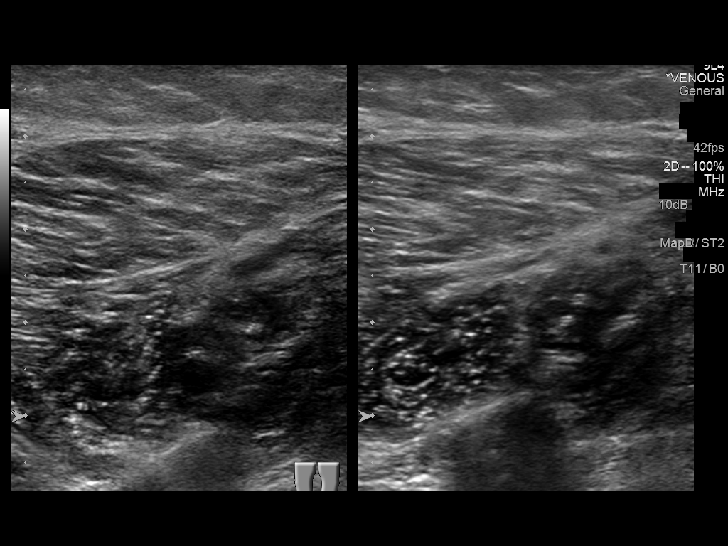
[im 14/30]
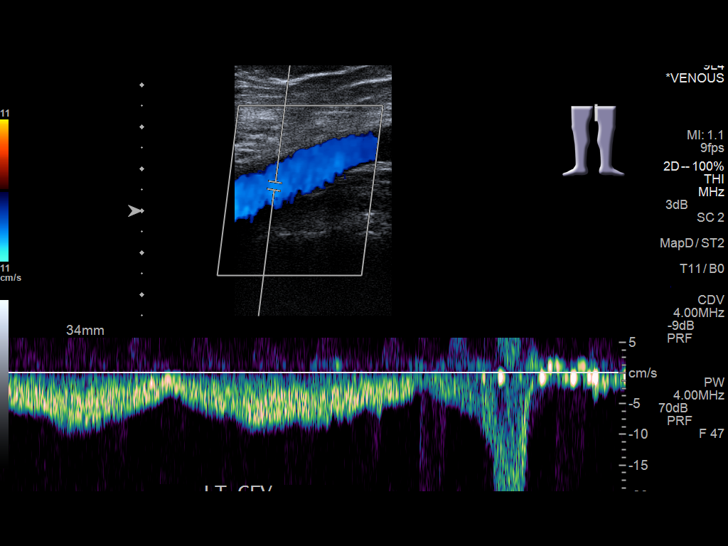
[im 16/30]
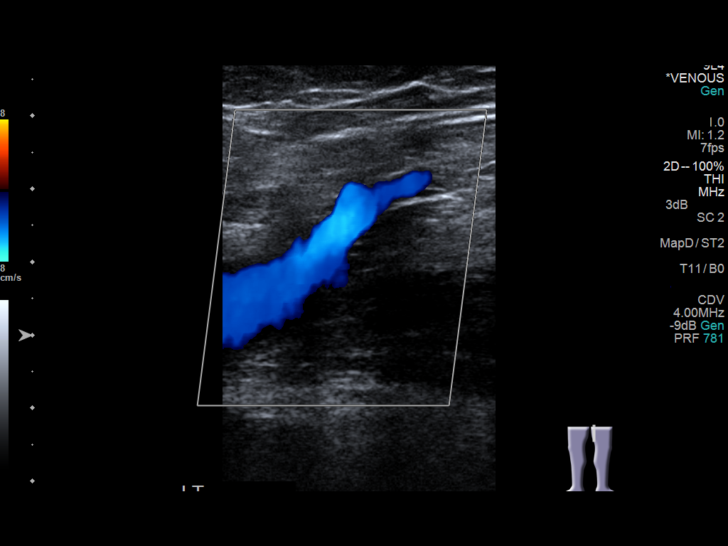
[im 18/30]
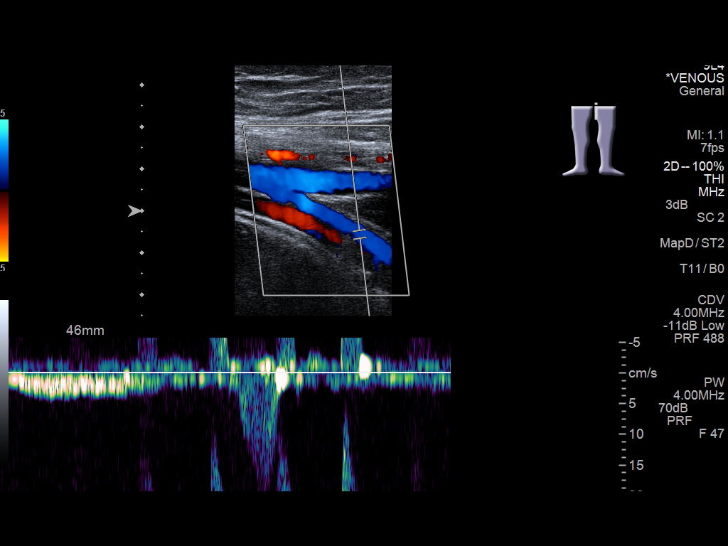
[im 21/30]
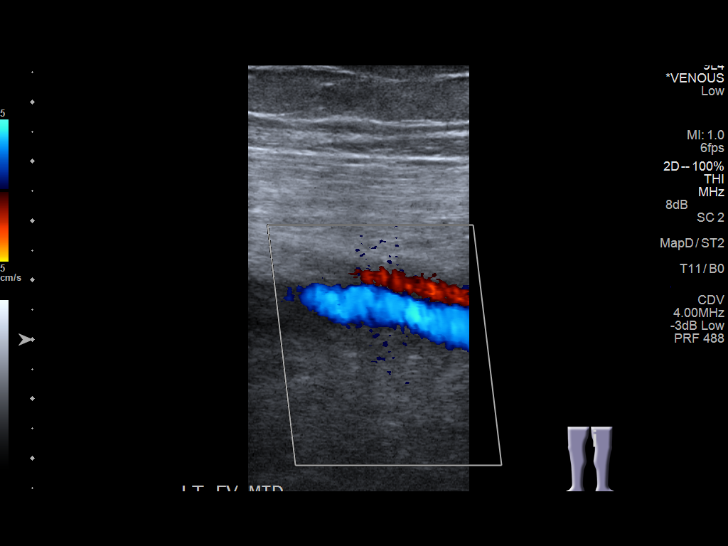
[im 23/30]
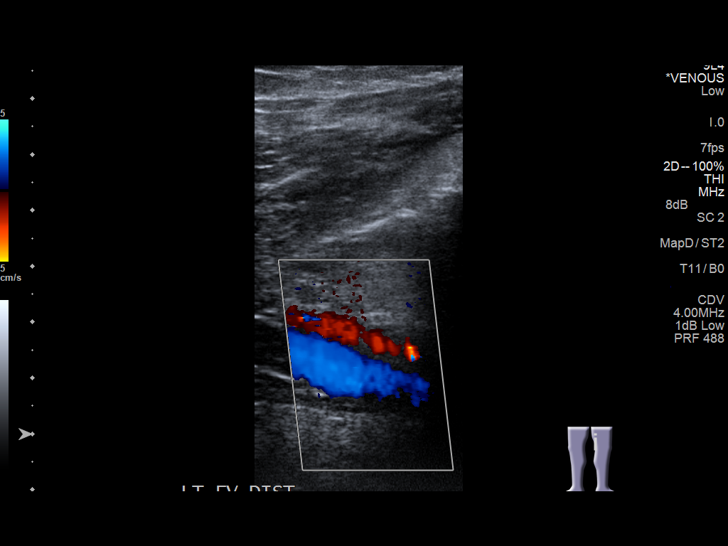
[im 24/30]
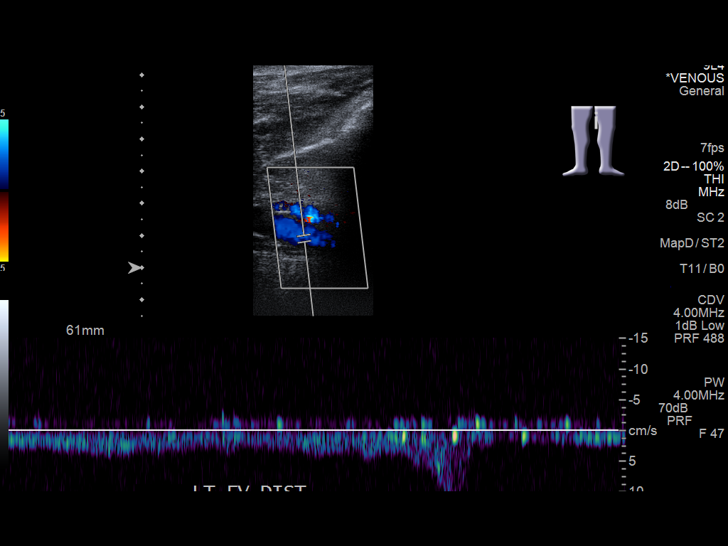
[im 27/30]
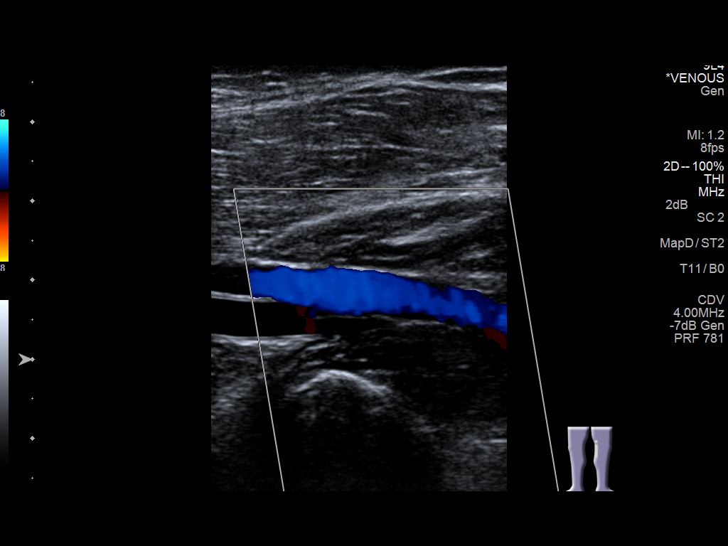
[im 30/30]
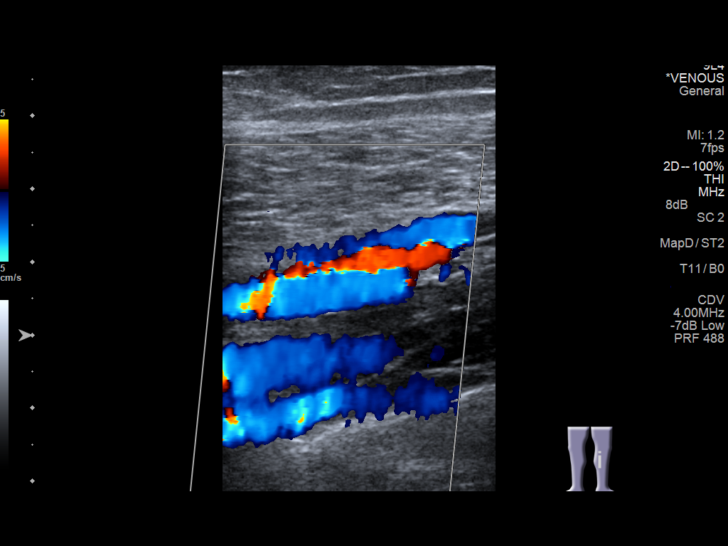

[14 of 24 positions shown; findings below may reference images not displayed]

FINDINGS: Normal compressibility of the common femoral, superficial femoral,
and popliteal veins, as well as the proximal calf veins. No filling
defects to suggest DVT on grayscale or color Doppler imaging.
Doppler waveforms show normal direction of venous flow, normal
respiratory phasicity and response to augmentation. Survey views of
the contralateral common femoral vein are unremarkable.
IMPRESSION: 1. No evidence of lower extremity deep vein thrombosis, LEFT.

## 2016-09-11 ENCOUNTER — Encounter (HOSPITAL_COMMUNITY): Payer: Self-pay

## 2016-11-22 ENCOUNTER — Encounter: Payer: Self-pay | Admitting: Emergency Medicine

## 2016-11-22 DIAGNOSIS — K5900 Constipation, unspecified: Secondary | ICD-10-CM | POA: Insufficient documentation

## 2016-11-22 DIAGNOSIS — Z5321 Procedure and treatment not carried out due to patient leaving prior to being seen by health care provider: Secondary | ICD-10-CM | POA: Insufficient documentation

## 2016-11-22 NOTE — ED Notes (Signed)
Pt updated on delay. Pt verbalizes understanding.  

## 2016-11-22 NOTE — ED Triage Notes (Signed)
Pt arrives ambulatory to triage with c/o constipation x 1 week. Pt reports that she is usually very regular. Pt is in NAD at this time.

## 2016-11-22 NOTE — ED Notes (Signed)
Pt reports taking suppositories but being unable to self administer an enema.

## 2016-11-23 ENCOUNTER — Emergency Department
Admission: EM | Admit: 2016-11-23 | Discharge: 2016-11-23 | Disposition: A | Discharge status: Home / Self Care | Payer: Self-pay | Attending: Emergency Medicine | Admitting: Emergency Medicine

## 2016-11-23 NOTE — ED Notes (Signed)
Pt no longer in lobby.  

## 2016-11-23 NOTE — ED Notes (Signed)
Pt states she is not sure she can wait much longer. Pt updated on delay and encouraged to stay.

## 2017-06-09 ENCOUNTER — Emergency Department: Payer: Self-pay

## 2017-06-09 ENCOUNTER — Emergency Department
Admission: EM | Admit: 2017-06-09 | Discharge: 2017-06-09 | Disposition: A | Discharge status: Home / Self Care | Payer: Self-pay | Attending: Emergency Medicine | Admitting: Emergency Medicine

## 2017-06-09 ENCOUNTER — Other Ambulatory Visit: Payer: Self-pay

## 2017-06-09 DIAGNOSIS — Z9049 Acquired absence of other specified parts of digestive tract: Secondary | ICD-10-CM | POA: Insufficient documentation

## 2017-06-09 DIAGNOSIS — Y9389 Activity, other specified: Secondary | ICD-10-CM | POA: Insufficient documentation

## 2017-06-09 DIAGNOSIS — I10 Essential (primary) hypertension: Secondary | ICD-10-CM | POA: Insufficient documentation

## 2017-06-09 DIAGNOSIS — Y998 Other external cause status: Secondary | ICD-10-CM | POA: Insufficient documentation

## 2017-06-09 DIAGNOSIS — S93491A Sprain of other ligament of right ankle, initial encounter: Secondary | ICD-10-CM | POA: Insufficient documentation

## 2017-06-09 DIAGNOSIS — W228XXA Striking against or struck by other objects, initial encounter: Secondary | ICD-10-CM | POA: Insufficient documentation

## 2017-06-09 DIAGNOSIS — Y929 Unspecified place or not applicable: Secondary | ICD-10-CM | POA: Insufficient documentation

## 2017-06-09 DIAGNOSIS — Z87891 Personal history of nicotine dependence: Secondary | ICD-10-CM | POA: Insufficient documentation

## 2017-06-09 MED ORDER — MELOXICAM 15 MG PO TABS: 15.0000 mg | ORAL_TABLET | Freq: Every day | ORAL | 1 refills | Status: AC

## 2017-06-09 NOTE — ED Triage Notes (Signed)
Pt arrives to ED via PIV from home with c/o RIGHT foot and ankle pain x2 weeks. Pt reports she "kicked a wooden crate 2 weeks ago" and pain and swelling has been increasing and constant ever since. No obvious deformity or dislocation observed, CMS intact.

## 2017-06-09 NOTE — ED Notes (Signed)

## 2017-06-09 NOTE — ED Provider Notes (Signed)
[facility] Emergency Department Provider Note  ____________________________________________  Time seen: Approximately 11:18 PM  I have reviewed the triage vital signs and the nursing notes.   HISTORY  Chief Complaint Ankle Pain and Foot Pain    HPI Carmen Turner is a 37 y.o. female presents to the emergency department with right ankle pain after patient reports that she kicked a wooden crate by accident 2 weeks ago.  Patient reports pain over the distribution of the anterior talofibular ligament.  She has noticed no weakness or numbness and tingling of the right foot.  She has been ambulating with some difficulty.  She denies prior right ankle sprains or traumas to the right foot.  No alleviating measures have been attempted.   Past Medical History:  Diagnosis Date  . Anemia   . Hypertension     Patient Active Problem List   Diagnosis Date Noted  . Cesarean deliv due to previous difficult deliv, deliv, curr hospitaliz 11/07/2015    Past Surgical History:  Procedure Laterality Date  . CESAREAN SECTION    . CESAREAN SECTION WITH BILATERAL TUBAL LIGATION N/A 11/07/2015   Procedure: CESAREAN SECTION WITH BILATERAL TUBAL LIGATION;  Surgeon: Nadara Mustard, MD;  Location: [facility] ORS;  Service: Obstetrics;  Laterality: N/A;  . CHOLECYSTECTOMY      Prior to Admission medications   Medication Sig Start Date End Date Taking? Authorizing Provider  meloxicam (MOBIC) 15 MG tablet Take 1 tablet (15 mg total) by mouth daily for 7 days. 06/09/17 06/16/17  Orvil Feil, PA-C  oxyCODONE-acetaminophen (PERCOCET/ROXICET) 5-325 MG tablet Take 2 tablets by mouth every 4 (four) hours as needed (pain scale > 7). 11/09/15   Conard Novak, MD    Allergies Ibuprofen  No family history on file.  Social History Social History   Tobacco Use  . Smoking status: Former Smoker    Packs/day: 1.50    Types: Cigarettes    Last attempt to quit: 05/24/2008    Years since  quitting: 9.0  . Smokeless tobacco: Never Used  Substance Use Topics  . Alcohol use: Yes  . Drug use: No     Review of Systems  Constitutional: No fever/chills Eyes: No visual changes. No discharge ENT: No upper respiratory complaints. Cardiovascular: no chest pain. Respiratory: no cough. No SOB. Gastrointestinal: No abdominal pain.  No nausea, no vomiting.  No diarrhea.  No constipation. Musculoskeletal: Patient has right ankle pain.  Skin: Negative for rash, abrasions, lacerations, ecchymosis. Neurological: Negative for headaches, focal weakness or numbness.  ____________________________________________   PHYSICAL EXAM:  VITAL SIGNS: ED Triage Vitals [06/09/17 2136]  Enc Vitals Group     BP (!) 170/99     Pulse Rate 83     Resp 18     Temp 98.1 F (36.7 C)     Temp Source Oral     SpO2 96 %     Weight 150 lb (68 kg)     Height 5\' 3"  (1.6 m)     Head Circumference      Peak Flow      Pain Score 7     Pain Loc      Pain Edu?      Excl. in GC?      Constitutional: Alert and oriented. Well appearing and in no acute distress. Eyes: Conjunctivae are normal. PERRL. EOMI. Head: Atraumatic.  Cardiovascular: Normal rate, regular rhythm. Normal S1 and S2.  Good peripheral circulation. Respiratory: Normal respiratory effort without  tachypnea or retractions. Lungs CTAB. Good air entry to the bases with no decreased or absent breath sounds. Musculoskeletal: Patient is unable to perform full range of motion of the right ankle, likely secondary to pain.  Tenderness is elicited with palpation over the anterior talofibular ligament but not the posterior talofibular ligament.  Patient is able to move all 5 right toes.  Palpable dorsalis pedis pulse, right. Neurologic:  Normal speech and language. No gross focal neurologic deficits are appreciated.  Skin:  Skin is warm, dry and intact. No rash noted. Psychiatric: Mood and affect are normal. Speech and behavior are normal. Patient  exhibits appropriate insight and judgement.   ____________________________________________   LABS (all labs ordered are listed, but only abnormal results are displayed)  Labs Reviewed - No data to display ____________________________________________  EKG   ____________________________________________  RADIOLOGY Geraldo Pitter, personally viewed and evaluated these images (plain radiographs) as part of my medical decision making, as well as reviewing the written report by the radiologist.  Dg Ankle Complete Right  Result Date: 06/09/2017 CLINICAL DATA:  Worsening pain after kicking crate 2 weeks ago. EXAM: RIGHT ANKLE - COMPLETE 3+ VIEW; RIGHT FOOT COMPLETE - 3+ VIEW COMPARISON:  RIGHT ankle radiograph December 26, 2012 FINDINGS: RIGHT foot: There is no evidence of fracture, dislocation, or joint effusion. There is no evidence of arthropathy or other focal bone abnormality. Soft tissues are unremarkable. RIGHT ankle: IMPRESSION: Negative. Electronically Signed   By: Awilda Metro M.D.   On: 06/09/2017 22:15   Dg Foot Complete Right  Result Date: 06/09/2017 CLINICAL DATA:  Worsening pain after kicking crate 2 weeks ago. EXAM: RIGHT ANKLE - COMPLETE 3+ VIEW; RIGHT FOOT COMPLETE - 3+ VIEW COMPARISON:  RIGHT ankle radiograph December 26, 2012 FINDINGS: RIGHT foot: There is no evidence of fracture, dislocation, or joint effusion. There is no evidence of arthropathy or other focal bone abnormality. Soft tissues are unremarkable. RIGHT ankle: IMPRESSION: Negative. Electronically Signed   By: Awilda Metro M.D.   On: 06/09/2017 22:15    ____________________________________________    PROCEDURES  Procedure(s) performed:    Procedures    Medications - No data to display   ____________________________________________   INITIAL IMPRESSION / ASSESSMENT AND PLAN / ED COURSE  Pertinent labs & imaging results that were available during my care of the patient were reviewed by  me and considered in my medical decision making (see chart for details).  Review of the [place] CSRS was performed in accordance of the [facility] prior to dispensing any controlled drugs.     Assessment and Plan Ankle pain Patient presents to the emergency department with right ankle pain after kicking a crate 2 weeks ago.  Differential diagnosis included fracture versus sprain.  No acute bony abnormality was identified on x-ray examination of the right ankle.  An Ace wrap was applied in the emergency department.  Patient was discharged with meloxicam and referred to podiatry.  Vital signs are reassuring aside from hypertension noted at triage.  All patient questions were answered.    ____________________________________________  FINAL CLINICAL IMPRESSION(S) / ED DIAGNOSES  Final diagnoses:  Sprain of anterior talofibular ligament of right ankle, initial encounter      NEW MEDICATIONS STARTED DURING THIS VISIT:  ED Discharge Orders        Ordered    meloxicam (MOBIC) 15 MG tablet  Daily     06/09/17 2310          This chart was dictated using voice  recognition software/Dragon. Despite best efforts to proofread, errors can occur which can change the meaning. Any change was purely unintentional.    Orvil FeilWoods, Jireh Vinas M, PA-C 06/09/17 2322    Phineas SemenGoodman, Gray[place], MD 06/09/17 [phone]

## 2020-06-06 ENCOUNTER — Encounter: Payer: Self-pay | Admitting: Advanced Practice Midwife

## 2020-06-06 ENCOUNTER — Ambulatory Visit (INDEPENDENT_AMBULATORY_CARE_PROVIDER_SITE_OTHER): Payer: Medicaid Other | Admitting: Advanced Practice Midwife

## 2020-06-06 ENCOUNTER — Other Ambulatory Visit (HOSPITAL_COMMUNITY)
Admission: RE | Admit: 2020-06-06 | Discharge: 2020-06-06 | Disposition: A | Discharge status: Home / Self Care | Payer: Self-pay | Source: Ambulatory Visit | Attending: Advanced Practice Midwife | Admitting: Advanced Practice Midwife

## 2020-06-06 ENCOUNTER — Other Ambulatory Visit: Payer: Self-pay

## 2020-06-06 VITALS — BP 135/83 | Ht 63.0 in | Wt 112.0 lb

## 2020-06-06 DIAGNOSIS — N898 Other specified noninflammatory disorders of vagina: Secondary | ICD-10-CM

## 2020-06-06 DIAGNOSIS — Z1159 Encounter for screening for other viral diseases: Secondary | ICD-10-CM

## 2020-06-06 DIAGNOSIS — Z113 Encounter for screening for infections with a predominantly sexual mode of transmission: Secondary | ICD-10-CM | POA: Diagnosis not present

## 2020-06-06 DIAGNOSIS — Z9189 Other specified personal risk factors, not elsewhere classified: Secondary | ICD-10-CM

## 2020-06-06 MED ORDER — FLUCONAZOLE 150 MG PO TABS: 150.0000 mg | ORAL_TABLET | Freq: Once | ORAL | 1 refills | Status: AC

## 2020-06-06 NOTE — Progress Notes (Addendum)
Patient ID: Carmen Turner, female   DOB: 05-28-1980, 40 y.o.   MRN: [id number]  Reason for Consult: Vaginitis (Itching,burning,discharge)    Subjective:  HPI:  Carmen Turner is a 40 y.o. female being seen for vaginitis symptoms that began a few days ago. Her symptoms are; vaginal discharge that is white, thick and clumpy, vaginal itching and vaginal burning. She was seen at urgent care and treated for a UTI without improvement. This morning she used 1 day monistat. She denies a carb heavy diet. She denies any change in body care products. She does wear tight fitting clothes. She has a new sex partner. She requests STD testing.  She is scheduled next week for annual exam with PAP.  Past Medical History:  Diagnosis Date  . Anemia   . Hypertension    History reviewed. No pertinent family history. Past Surgical History:  Procedure Laterality Date  . CESAREAN SECTION    . CESAREAN SECTION WITH BILATERAL TUBAL LIGATION N/A 11/07/2015   Procedure: CESAREAN SECTION WITH BILATERAL TUBAL LIGATION;  Surgeon: Nadara Mustard, MD;  Location: [facility] ORS;  Service: Obstetrics;  Laterality: N/A;  . CHOLECYSTECTOMY      Short Social History:  Social History   Tobacco Use  . Smoking status: Former Smoker    Packs/day: 1.50    Types: Cigarettes    Quit date: 05/24/2008    Years since quitting: 12.0  . Smokeless tobacco: Never Used  Substance Use Topics  . Alcohol use: Yes    Allergies  Allergen Reactions  . Ibuprofen Swelling    Current Outpatient Medications  Medication Sig Dispense Refill  . fluconazole (DIFLUCAN) 150 MG tablet Take 1 tablet (150 mg total) by mouth once for 1 dose. Can take additional dose three days later if symptoms persist 1 tablet 1  . oxyCODONE-acetaminophen (PERCOCET/ROXICET) 5-325 MG tablet Take 2 tablets by mouth every 4 (four) hours as needed (pain scale > 7). (Patient not taking: Reported on 06/06/2020) 30 tablet 0   No current facility-administered  medications for this visit.    Review of Systems  Constitutional: Negative for chills and fever.  HENT: Negative for congestion, ear discharge, ear pain, hearing loss, sinus pain and sore throat.   Eyes: Negative for blurred vision and double vision.  Respiratory: Negative for cough, shortness of breath and wheezing.   Cardiovascular: Negative for chest pain, palpitations and leg swelling.  Gastrointestinal: Negative for abdominal pain, blood in stool, constipation, diarrhea, heartburn, melena, nausea and vomiting.  Genitourinary: Negative for dysuria, flank pain, frequency, hematuria and urgency.       Positive for vaginal discharge, itching, burning  Musculoskeletal: Negative for back pain, joint pain and myalgias.  Skin: Negative for itching and rash.  Neurological: Negative for dizziness, tingling, tremors, sensory change, speech change, focal weakness, seizures, loss of consciousness, weakness and headaches.  Endo/Heme/Allergies: Negative for environmental allergies. Does not bruise/bleed easily.  Psychiatric/Behavioral: Negative for depression, hallucinations, memory loss, substance abuse and suicidal ideas. The patient is not nervous/anxious and does not have insomnia.         Objective:  Objective   Vitals:   06/06/20 1426  BP: 135/83  Weight: 112 lb (50.8 kg)  Height: 5\' 3"  (1.6 m)   Body mass index is 19.84 kg/m. Constitutional: Well nourished, well developed female in no acute distress.  HEENT: normal Skin: Warm and dry.  Cardiovascular: Regular rate and rhythm.   Extremity: no edema Respiratory: Clear to auscultation bilateral. Normal respiratory effort  Neuro: DTRs 2+, Cranial nerves grossly intact Psych: Alert and Oriented x3. No memory deficits. Normal mood and affect.  MS: normal gait, normal bilateral lower extremity ROM/strength/stability.  Pelvic exam:  is not limited by body habitus EGBUS: within normal limits Vagina: within normal limits, normal  mucosa, monistat in vault    Assessment/Plan:     40 y.o. G6 P4 female with vaginitis likely yeast, will treat empirically  Aptima: vaginitis/STDs Blood work: HIV, RPR, Hepatitis panel   Tresea Mall CNM [facility] 06/06/2020, 2:56 PM

## 2020-06-07 LAB — HEPATITIS PANEL, ACUTE
Hep A IgM: NEGATIVE
Hep B C IgM: NEGATIVE
Hep C Virus Ab: <0.1 s/co ratio (ref 0.0–0.9)
Hepatitis B Surface Ag: NEGATIVE

## 2020-06-07 LAB — RPR QUALITATIVE: RPR Ser Ql: NONREACTIVE

## 2020-06-07 LAB — HIV ANTIBODY (ROUTINE TESTING W REFLEX): HIV Screen 4th Generation wRfx: NONREACTIVE

## 2020-06-10 ENCOUNTER — Telehealth: Payer: Self-pay

## 2020-06-10 ENCOUNTER — Other Ambulatory Visit: Payer: Self-pay | Admitting: Advanced Practice Midwife

## 2020-06-10 DIAGNOSIS — B373 Candidiasis of vulva and vagina: Secondary | ICD-10-CM

## 2020-06-10 DIAGNOSIS — N760 Acute vaginitis: Secondary | ICD-10-CM

## 2020-06-10 DIAGNOSIS — B3731 Acute candidiasis of vulva and vagina: Secondary | ICD-10-CM

## 2020-06-10 DIAGNOSIS — B9689 Other specified bacterial agents as the cause of diseases classified elsewhere: Secondary | ICD-10-CM

## 2020-06-10 LAB — CERVICOVAGINAL ANCILLARY ONLY
Bacterial Vaginitis (gardnerella): POSITIVE — AB
Candida Glabrata: NEGATIVE
Candida Vaginitis: POSITIVE — AB
Chlamydia: NEGATIVE
Comment: NEGATIVE
Comment: NEGATIVE
Comment: NEGATIVE
Comment: NEGATIVE
Comment: NEGATIVE
Comment: NORMAL
Neisseria Gonorrhea: NEGATIVE
Trichomonas: NEGATIVE

## 2020-06-10 MED ORDER — FLUCONAZOLE 150 MG PO TABS: 150.0000 mg | ORAL_TABLET | Freq: Once | ORAL | 1 refills | Status: AC

## 2020-06-10 MED ORDER — METRONIDAZOLE 500 MG PO TABS: 500.0000 mg | ORAL_TABLET | Freq: Two times a day (BID) | ORAL | 0 refills | Status: AC

## 2020-06-10 NOTE — Telephone Encounter (Signed)
Pt calling; was seen in [place] Thurs; had positive results for something else at a diff dr's office; doesn't think she was treated for the right thing b/c she is is still having pain.  [phone]

## 2020-06-10 NOTE — Progress Notes (Signed)
Rx metronidazole and diflucan sent. Lab results; BV and yeast. Patient aware.

## 2020-06-11 NOTE — Telephone Encounter (Signed)
Rx's ordered to treat BV and yeast and patient aware.

## 2020-06-12 ENCOUNTER — Other Ambulatory Visit: Payer: Self-pay

## 2020-06-12 ENCOUNTER — Encounter: Payer: Self-pay | Admitting: Obstetrics

## 2020-06-12 ENCOUNTER — Other Ambulatory Visit (HOSPITAL_COMMUNITY)
Admission: RE | Admit: 2020-06-12 | Discharge: 2020-06-12 | Disposition: A | Discharge status: Home / Self Care | Payer: Medicaid Other | Source: Ambulatory Visit | Attending: Obstetrics | Admitting: Obstetrics

## 2020-06-12 ENCOUNTER — Ambulatory Visit (INDEPENDENT_AMBULATORY_CARE_PROVIDER_SITE_OTHER): Payer: Medicaid Other | Admitting: Obstetrics

## 2020-06-12 VITALS — BP 120/80 | Ht 63.0 in | Wt 114.0 lb

## 2020-06-12 DIAGNOSIS — Z01419 Encounter for gynecological examination (general) (routine) without abnormal findings: Secondary | ICD-10-CM

## 2020-06-12 DIAGNOSIS — Z124 Encounter for screening for malignant neoplasm of cervix: Secondary | ICD-10-CM

## 2020-06-12 DIAGNOSIS — Z0000 Encounter for general adult medical examination without abnormal findings: Secondary | ICD-10-CM | POA: Diagnosis not present

## 2020-06-12 NOTE — Progress Notes (Signed)
Gynecology Annual Exam   PCP: Carmen Turner, No Pcp Per (Inactive)  Chief Complaint:  Chief Complaint  Carmen Turner presents with  . Annual Exam    History of Present Illness: Carmen Turner is a 40 y.o. G6P0004 presents for annual exam. The Carmen Turner has no complaints today.   LMP: Carmen Turner's last menstrual period was 05/19/2020. Average Interval: regular, 28 days Duration of flow: 5 days Heavy Menses: no Clots: no Intermenstrual Bleeding: no Postcoital Bleeding: no Dysmenorrhea: no  She recenlty separated from her husband and is in a new relationship., Seen several days ago and diagnosed with BV and yeast.  She currently uses tubal ligation for contraception. She denies dyspareunia.  The Carmen Turner does not perform self breast exams.  There is no notable family history of breast or ovarian cancer in her family.  The Carmen Turner wears seatbelts: yes.   The Carmen Turner has regular exercise: yes.    The Carmen Turner denies current symptoms of depression.    Review of Systems: ROS  Past Medical History:  Carmen Turner Active Problem List   Diagnosis Date Noted  . Cesarean deliv due to previous difficult deliv, deliv, curr hospitaliz 11/07/2015    Past Surgical History:  Past Surgical History:  Procedure Laterality Date  . CESAREAN SECTION    . CESAREAN SECTION WITH BILATERAL TUBAL LIGATION N/A 11/07/2015   Procedure: CESAREAN SECTION WITH BILATERAL TUBAL LIGATION;  Surgeon: Nadara Mustard, MD;  Location: [facility] ORS;  Service: Obstetrics;  Laterality: N/A;  . CHOLECYSTECTOMY      Gynecologic History:  Carmen Turner's last menstrual period was 05/19/2020. Contraception: tubal ligation Last Pap: Results were: no abnormalities   Obstetric History: [id number]  Family History:  History reviewed. No pertinent family history.  Social History:  Social History   Socioeconomic History  . Marital status: Married    Spouse name: Not on file  . Number of children: Not on file  . Years of education: Not on file  .  Highest education level: Not on file  Occupational History  . Not on file  Tobacco Use  . Smoking status: Former Smoker    Packs/day: 1.50    Types: Cigarettes    Quit date: 05/24/2008    Years since quitting: 12.0  . Smokeless tobacco: Never Used  Substance and Sexual Activity  . Alcohol use: Yes  . Drug use: No  . Sexual activity: Yes  Other Topics Concern  . Not on file  Social History Narrative  . Not on file   Social Determinants of Health   Financial Resource Strain: Not on file  Food Insecurity: Not on file  Transportation Needs: Not on file  Physical Activity: Not on file  Stress: Not on file  Social Connections: Not on file  Intimate Partner Violence: Not on file    Allergies:  Allergies  Allergen Reactions  . Ibuprofen Swelling    Medications: Prior to Admission medications   Medication Sig Start Date End Date Taking? Authorizing Provider  metroNIDAZOLE (FLAGYL) 500 MG tablet Take 1 tablet (500 mg total) by mouth 2 (two) times daily for 7 days. 06/10/20 06/17/20 Yes Tresea Mall, CNM  phenazopyridine (PYRIDIUM) 200 MG tablet Take 200 mg by mouth 3 (three) times daily. Carmen Turner not taking: Reported on 06/12/2020 06/04/20   [provider]    Physical Exam Vitals: Blood pressure 120/80, height 5\' 3"  (1.6 m), weight 114 lb (51.7 kg), last menstrual period 05/19/2020.  General: NAD HEENT: normocephalic, anicteric Thyroid: no enlargement, no palpable nodules Pulmonary: No  increased work of breathing, CTAB Cardiovascular: RRR, distal pulses 2+ Breast: Breast symmetrical, no tenderness, no palpable nodules or masses, no skin or nipple retraction present, no nipple discharge.  No axillary or supraclavicular lymphadenopathy. Abdomen: NABS, soft, non-tender, non-distended.  Umbilicus without lesions.  No hepatomegaly, splenomegaly or masses palpable. No evidence of hernia  Genitourinary:  External: Normal external female genitalia.  Normal urethral meatus,  normal Bartholin's and Skene's glands.    Vagina: Normal vaginal mucosa, no evidence of prolapse.    Cervix: Grossly normal in appearance, no bleeding  Uterus: Non-enlarged, mobile, normal contour.  No CMT  Adnexa: ovaries non-enlarged, no adnexal masses  Rectal: deferred  Lymphatic: no evidence of inguinal lymphadenopathy Extremities: no edema, erythema, or tenderness Neurologic: Grossly intact Psychiatric: mood appropriate, affect full  Female chaperone present for pelvic and breast  portions of the physical exam    Assessment: 40 y.o. G6P0004 routine annual exam  Plan: Problem List Items Addressed This Visit   None   Visit Diagnoses    Women's annual routine gynecological examination    -  Primary   Relevant Orders   Cytology - PAP   Cervical cancer screening       Relevant Orders   Cytology - PAP      2) STI screening  wasoffered and declinedShe jsut had STI screening a few days ago.  2)  ASCCP guidelines and rational discussed.  Carmen Turner opts for yearly screening interval  3) Contraception - the Carmen Turner is currently using  tubal ligation.  She is happy with her current form of contraception and plans to continue  4) Routine healthcare maintenance including cholesterol, diabetes screening discussed Declines  5) Return in about 1 year (around 06/12/2021) for annual.  Mirna Mires, CNM  06/12/2020 2:01 PM    [facility], [facility] 06/12/2020, 1:59 PM

## 2020-06-14 ENCOUNTER — Encounter: Payer: Self-pay | Admitting: Obstetrics

## 2020-06-14 LAB — CYTOLOGY - PAP: Diagnosis: NEGATIVE

## 2020-07-09 ENCOUNTER — Ambulatory Visit (INDEPENDENT_AMBULATORY_CARE_PROVIDER_SITE_OTHER): Payer: Medicaid Other | Admitting: Obstetrics and Gynecology

## 2020-07-09 ENCOUNTER — Other Ambulatory Visit (HOSPITAL_COMMUNITY)
Admission: RE | Admit: 2020-07-09 | Discharge: 2020-07-09 | Disposition: A | Discharge status: Home / Self Care | Payer: Medicaid Other | Source: Ambulatory Visit | Attending: Obstetrics and Gynecology | Admitting: Obstetrics and Gynecology

## 2020-07-09 ENCOUNTER — Other Ambulatory Visit: Payer: Self-pay

## 2020-07-09 ENCOUNTER — Encounter: Payer: Self-pay | Admitting: Obstetrics and Gynecology

## 2020-07-09 VITALS — Ht 63.0 in | Wt 112.0 lb

## 2020-07-09 DIAGNOSIS — R300 Dysuria: Secondary | ICD-10-CM | POA: Diagnosis not present

## 2020-07-09 DIAGNOSIS — N898 Other specified noninflammatory disorders of vagina: Secondary | ICD-10-CM | POA: Insufficient documentation

## 2020-07-09 DIAGNOSIS — B3731 Acute candidiasis of vulva and vagina: Secondary | ICD-10-CM

## 2020-07-09 DIAGNOSIS — B373 Candidiasis of vulva and vagina: Secondary | ICD-10-CM | POA: Insufficient documentation

## 2020-07-09 LAB — POCT URINALYSIS DIPSTICK
Bilirubin, UA: NEGATIVE
Blood, UA: NEGATIVE
Glucose, UA: NEGATIVE
Ketones, UA: NEGATIVE
Nitrite, UA: NEGATIVE
Protein, UA: NEGATIVE
Spec Grav, UA: 1.025 (ref 1.010–1.025)
Urobilinogen, UA: 4 E.U./dL — AB
pH, UA: 5 (ref 5.0–8.0)

## 2020-07-09 MED ORDER — CLOTRIMAZOLE-BETAMETHASONE 1-0.05 % EX CREA: 1.0000 "application " | TOPICAL_CREAM | Freq: Two times a day (BID) | CUTANEOUS | 0 refills | Status: DC

## 2020-07-10 LAB — CERVICOVAGINAL ANCILLARY ONLY
Bacterial Vaginitis (gardnerella): NEGATIVE
Candida Glabrata: NEGATIVE
Candida Vaginitis: POSITIVE — AB
Comment: NEGATIVE
Comment: NEGATIVE
Comment: NEGATIVE

## 2020-07-12 NOTE — Progress Notes (Signed)
Patient ID: Carmen Turner, female   DOB: 03-15-1980, 40 y.o.   MRN: [id number]  Reason for Visit: Urinary Tract Infection (Burning with urination and pain. RM 4)   Subjective:     HPI:  Carmen Turner is a 40 y.o. female who presents with concern for burning/irritation when voiding and general vaginal irritation. Patient reports recent treatment for yeast and BV. She denies current vaginal discharge or increase in urinary frequency/pelvic pressure. She is sexually active, declines screening for STIs at today's visit.  Gynecological History LMP: 07/05/20 Describes periods as regular Last pap smear: 06/12/20 - NILM Last Mammogram: n/a Sexually Active: yes  Obstetrical History G6P4  Past Medical History:  Diagnosis Date  . Anemia   . Hypertension    No family history on file. Past Surgical History:  Procedure Laterality Date  . CESAREAN SECTION    . CESAREAN SECTION WITH BILATERAL TUBAL LIGATION N/A 11/07/2015   Procedure: CESAREAN SECTION WITH BILATERAL TUBAL LIGATION;  Surgeon: Nadara Mustard, MD;  Location: [facility] ORS;  Service: Obstetrics;  Laterality: N/A;  . CHOLECYSTECTOMY      Short Social History:  Social History   Tobacco Use  . Smoking status: Former Smoker    Packs/day: 1.50    Types: Cigarettes    Quit date: 05/24/2008    Years since quitting: 12.1  . Smokeless tobacco: Never Used  Substance Use Topics  . Alcohol use: Yes    Allergies  Allergen Reactions  . Ibuprofen Swelling    Current Outpatient Medications  Medication Sig Dispense Refill  . clotrimazole-betamethasone (LOTRISONE) cream Apply 1 application topically 2 (two) times daily. 30 g 0  . phenazopyridine (PYRIDIUM) 200 MG tablet Take 200 mg by mouth 3 (three) times daily. (Patient not taking: Reported on 06/12/2020)     No current facility-administered medications for this visit.    Review of Systems  Constitutional:  Constitutional negative. HENT: HENT negative.  Eyes: Eyes negative.   Respiratory: Respiratory negative.  Cardiovascular: Cardiovascular negative.  GI: Gastrointestinal negative.  GU:       Vaginal irritation Burning with voiding Musculoskeletal: Musculoskeletal negative.  Skin: Skin negative.  Neurological: Neurological negative. Hematologic: Hematologic/lymphatic negative.  Psychiatric: Psychiatric negative.      Objective:  Objective   Vitals:   07/09/20 1116  Weight: 112 lb (50.8 kg)  Height: 5\' 3"  (1.6 m)   Body mass index is 19.84 kg/m.  Physical Exam Vitals reviewed.  Constitutional:      Appearance: Normal appearance.  Pulmonary:     Effort: Pulmonary effort is normal.  Abdominal:     General: Abdomen is flat.     Palpations: Abdomen is soft.  Genitourinary:    Comments: External: Vulva grossly erythematous at vaginal introitus. No lesions noted.  Speculum examination: Normal appearing cervix. No blood in the vaginal vault. Small amount of white, adherent  discharge.    Neurological:     Mental Status: She is alert.    Microscopic wet-mount exam shows hyphae. Negative whiff, no clue cells noted. pH 4.5.  Assessment/Plan:    40 yo, G6P4, with acute candidal vaginitis.    Visit Diagnoses    Vaginal irritation    -  Primary   Relevant Orders   Cervicovaginal ancillary only (Completed)   Burning with urination       Relevant Orders   POCT Urinalysis Dipstick (Completed)   Vulval candidiasis       Relevant Medications   clotrimazole-betamethasone (LOTRISONE) cream   Other  Relevant Orders   Cervicovaginal ancillary only (Completed)     Follow-up if symptoms worsen or persist.  Zipporah Plants, CNM [facility], [facility] 07/12/2020 5:22 PM

## 2020-07-15 ENCOUNTER — Other Ambulatory Visit: Payer: Self-pay | Admitting: Obstetrics

## 2020-07-15 DIAGNOSIS — B373 Candidiasis of vulva and vagina: Secondary | ICD-10-CM

## 2020-07-15 DIAGNOSIS — B3731 Acute candidiasis of vulva and vagina: Secondary | ICD-10-CM

## 2020-07-15 MED ORDER — FLUCONAZOLE 150 MG PO TABS
150.0000 mg | ORAL_TABLET | Freq: Once | ORAL | 0 refills | Status: DC
Start: 2020-07-15 — End: 2021-03-12

## 2020-08-09 ENCOUNTER — Other Ambulatory Visit: Payer: Self-pay

## 2020-08-09 ENCOUNTER — Encounter: Payer: Self-pay | Admitting: Obstetrics and Gynecology

## 2020-08-09 ENCOUNTER — Ambulatory Visit (INDEPENDENT_AMBULATORY_CARE_PROVIDER_SITE_OTHER): Payer: Medicaid Other | Admitting: Obstetrics and Gynecology

## 2020-08-09 VITALS — BP 120/90 | Ht 63.0 in | Wt 111.0 lb

## 2020-08-09 DIAGNOSIS — N760 Acute vaginitis: Secondary | ICD-10-CM

## 2020-08-09 DIAGNOSIS — B9689 Other specified bacterial agents as the cause of diseases classified elsewhere: Secondary | ICD-10-CM

## 2020-08-09 DIAGNOSIS — B3731 Acute candidiasis of vulva and vagina: Secondary | ICD-10-CM

## 2020-08-09 DIAGNOSIS — B373 Candidiasis of vulva and vagina: Secondary | ICD-10-CM

## 2020-08-09 MED ORDER — METRONIDAZOLE 500 MG PO TABS
500.0000 mg | ORAL_TABLET | Freq: Two times a day (BID) | ORAL | 0 refills | Status: AC
Start: 2020-08-09 — End: 2020-08-16

## 2020-08-09 MED ORDER — FLUCONAZOLE 150 MG PO TABS
150.0000 mg | ORAL_TABLET | Freq: Once | ORAL | 0 refills | Status: AC
Start: 2020-08-09 — End: 2020-08-09

## 2020-08-09 NOTE — Progress Notes (Signed)
Obstetrics & Gynecology Office Visit   Chief Complaint:  Chief Complaint  Patient presents with   Vaginal Discharge    Irritation, abnormal odor, itchiness x 3    History of Present Illness: Vaginitis: Patient complains of an abnormal vaginal discharge for 3 day. Vaginal symptoms include vulvar itching.Vulvar symptoms include local irritation and vulvar itching.STI Risk: Very low risk of STD exposureDischarge described as: white, thick, and malodorous..Menstrual pattern: She had similar symptoms in April and this was treated. This was the first time she ever had BV.      Past Medical History:  Diagnosis Date   Anemia    Hypertension     Past Surgical History:  Procedure Laterality Date   CESAREAN SECTION     CESAREAN SECTION WITH BILATERAL TUBAL LIGATION N/A 11/07/2015   Procedure: CESAREAN SECTION WITH BILATERAL TUBAL LIGATION;  Surgeon: Nadara Mustard, MD;  Location: [facility] ORS;  Service: Obstetrics;  Laterality: N/A;   CHOLECYSTECTOMY      Gynecologic History: Patient's last menstrual period was 07/31/2020 (approximate).  Obstetric History: [id number]  History reviewed. No pertinent family history.  Social History   Socioeconomic History   Marital status: Married    Spouse name: Not on file   Number of children: Not on file   Years of education: Not on file   Highest education level: Not on file  Occupational History   Not on file  Tobacco Use   Smoking status: Former    Packs/day: 1.50    Pack years: 0.00    Types: Cigarettes    Quit date: 05/24/2008    Years since quitting: 12.2   Smokeless tobacco: Never  Vaping Use   Vaping Use: Never used  Substance and Sexual Activity   Alcohol use: Yes   Drug use: No   Sexual activity: Yes    Birth control/protection: Surgical    Comment: Tubal  Other Topics Concern   Not on file  Social History Narrative   Not on file   Social Determinants of Health   Financial Resource Strain: Not on file  Food Insecurity: Not  on file  Transportation Needs: Not on file  Physical Activity: Not on file  Stress: Not on file  Social Connections: Not on file  Intimate Partner Violence: Not on file    Allergies  Allergen Reactions   Ibuprofen Swelling    Prior to Admission medications   Medication Sig Start Date End Date Taking? Authorizing Provider  clotrimazole-betamethasone (LOTRISONE) cream Apply 1 application topically 2 (two) times daily. 07/09/20  Yes Zipporah Plants, CNM    Review of Systems  Constitutional: Negative.   HENT: Negative.    Eyes: Negative.   Respiratory: Negative.    Cardiovascular: Negative.   Gastrointestinal: Negative.   Genitourinary: Negative.        See HPI  Musculoskeletal: Negative.   Skin: Negative.   Neurological: Negative.   Psychiatric/Behavioral: Negative.      Physical Exam BP 120/90   Ht 5\' 3"  (1.6 m)   Wt 111 lb (50.3 kg)   LMP 07/31/2020 (Approximate)   BMI 19.66 kg/m  Patient's last menstrual period was 07/31/2020 (approximate). Physical Exam Constitutional:      General: She is not in acute distress.    Appearance: Normal appearance. She is well-developed.  Genitourinary:     Vulva, bladder and urethral meatus normal.     Right Labia: No rash, tenderness, lesions, skin changes or Bartholin's cyst.    Left Labia: No tenderness, skin changes,  Bartholin's cyst or rash.    No inguinal adenopathy present in the right or left side.    Pelvic Tanner Score: 5/5.     Right Adnexa: not tender, not full and no mass present.    Left Adnexa: not tender, not full and no mass present.    No cervical motion tenderness, friability, lesion or polyp.     Uterus is not enlarged, fixed or tender.     Uterus is anteverted.     No urethral tenderness or mass present.     Pelvic exam was performed with patient in the lithotomy position.  HENT:     Head: Normocephalic and atraumatic.  Eyes:     General: No scleral icterus.    Conjunctiva/sclera: Conjunctivae normal.   Cardiovascular:     Rate and Rhythm: Normal rate and regular rhythm.     Heart sounds: No murmur heard.   No friction rub. No gallop.  Pulmonary:     Effort: Pulmonary effort is normal. No respiratory distress.     Breath sounds: Normal breath sounds. No wheezing or rales.  Abdominal:     General: Bowel sounds are normal. There is no distension.     Palpations: Abdomen is soft. There is no mass.     Tenderness: There is no abdominal tenderness. There is no guarding or rebound.     Hernia: There is no hernia in the left inguinal area or right inguinal area.  Musculoskeletal:        General: Normal range of motion.     Cervical back: Normal range of motion and neck supple.  Lymphadenopathy:     Lower Body: No right inguinal adenopathy. No left inguinal adenopathy.  Neurological:     General: No focal deficit present.     Mental Status: She is alert and oriented to person, place, and time.     Cranial Nerves: No cranial nerve deficit.  Skin:    General: Skin is warm and dry.     Findings: No erythema.  Psychiatric:        Mood and Affect: Mood normal.        Behavior: Behavior normal.        Judgment: Judgment normal.   Female chaperone present for pelvic and breast  portions of the physical exam  Wet Prep: Clue Cells: Positive Fungal elements: Positive Trichomonas: Negative   Assessment: 40 y.o. G22P0004 female here for  1. BV (bacterial vaginosis)   2. Vulval candidiasis      Plan: Problem List Items Addressed This Visit   None Visit Diagnoses     BV (bacterial vaginosis)    -  Primary   Relevant Medications   metroNIDAZOLE (FLAGYL) 500 MG tablet   Vulval candidiasis       Relevant Medications   Fluconazole 150 mg tablet      I will treat her for vaginitis at this time.  This appears to represent 2 bacterial vaginosis infections in a relatively short span of time when she had no history of the same prior to recently.  We discussed suppressive regimens that could  be considered, if she develops another infection soon.  She also had evidence of a yeast infection, though mild.  We will treat this as well.  A total of 23 minutes were spent face-to-face with the patient as well as preparation, review, communication, and documentation during this encounter.    Thomasene Mohair, MD 08/09/2020 4:58 PM

## 2020-08-14 ENCOUNTER — Telehealth: Payer: Self-pay

## 2020-08-14 NOTE — Telephone Encounter (Signed)
Pt calling ;was seen last Fri and tx for BV; has had no relief; test results not in [facility].  [phone]

## 2020-08-15 NOTE — Progress Notes (Deleted)
    Patient, No Pcp Per (Inactive)   No chief complaint on file.   HPI:      Carmen Turner is a 39 y.o. G6P0004 whose LMP was Patient's last menstrual period was 07/31/2020 (approximate)., presents today for *** Treatec for yeast and Vb with SDJ last wk no relief.  Yeast no BV on 5/22 culture   Past Medical History:  Diagnosis Date   Anemia    Hypertension     Past Surgical History:  Procedure Laterality Date   CESAREAN SECTION     CESAREAN SECTION WITH BILATERAL TUBAL LIGATION N/A 11/07/2015   Procedure: CESAREAN SECTION WITH BILATERAL TUBAL LIGATION;  Surgeon: Nadara Mustard, MD;  Location: [facility] ORS;  Service: Obstetrics;  Laterality: N/A;   CHOLECYSTECTOMY      No family history on file.  Social History   Socioeconomic History   Marital status: Married    Spouse name: Not on file   Number of children: Not on file   Years of education: Not on file   Highest education level: Not on file  Occupational History   Not on file  Tobacco Use   Smoking status: Former    Packs/day: 1.50    Pack years: 0.00    Types: Cigarettes    Quit date: 05/24/2008    Years since quitting: 12.2   Smokeless tobacco: Never  Vaping Use   Vaping Use: Never used  Substance and Sexual Activity   Alcohol use: Yes   Drug use: No   Sexual activity: Yes    Birth control/protection: Surgical    Comment: Tubal  Other Topics Concern   Not on file  Social History Narrative   Not on file   Social Determinants of Health   Financial Resource Strain: Not on file  Food Insecurity: Not on file  Transportation Needs: Not on file  Physical Activity: Not on file  Stress: Not on file  Social Connections: Not on file  Intimate Partner Violence: Not on file    Outpatient Medications Prior to Visit  Medication Sig Dispense Refill   clotrimazole-betamethasone (LOTRISONE) cream Apply 1 application topically 2 (two) times daily. 30 g 0   metroNIDAZOLE (FLAGYL) 500 MG tablet Take 1 tablet  (500 mg total) by mouth 2 (two) times daily for 7 days. 14 tablet 0   No facility-administered medications prior to visit.      ROS:  Review of Systems BREAST: No symptoms   OBJECTIVE:   Vitals:  LMP 07/31/2020 (Approximate)   Physical Exam  Results: No results found for this or any previous visit (from the past 24 hour(s)).   Assessment/Plan: No diagnosis found.    No orders of the defined types were placed in this encounter.     No follow-ups on file.  Caira Poche B. Cleophas Yoak, PA-C 08/15/2020 12:44 PM

## 2020-08-19 ENCOUNTER — Ambulatory Visit: Payer: Medicaid Other | Admitting: Obstetrics and Gynecology

## 2020-08-20 NOTE — Telephone Encounter (Signed)
MyChart msg sent to pt advising her per SDJ to come back in for further testing.

## 2020-09-13 NOTE — Telephone Encounter (Signed)
Please advise 

## 2020-09-14 ENCOUNTER — Other Ambulatory Visit: Payer: Self-pay | Admitting: Advanced Practice Midwife

## 2020-09-14 DIAGNOSIS — B373 Candidiasis of vulva and vagina: Secondary | ICD-10-CM

## 2020-09-14 DIAGNOSIS — B3731 Acute candidiasis of vulva and vagina: Secondary | ICD-10-CM

## 2020-09-14 MED ORDER — FLUCONAZOLE 150 MG PO TABS: 150.0000 mg | ORAL_TABLET | Freq: Once | ORAL | 3 refills | Status: AC

## 2020-09-14 NOTE — Progress Notes (Signed)
Rx diflucan per patient request/message sent to patient.

## 2020-12-10 ENCOUNTER — Other Ambulatory Visit: Payer: Self-pay

## 2020-12-10 ENCOUNTER — Encounter: Payer: Self-pay | Admitting: Obstetrics and Gynecology

## 2020-12-10 ENCOUNTER — Ambulatory Visit (INDEPENDENT_AMBULATORY_CARE_PROVIDER_SITE_OTHER): Payer: Medicaid Other | Admitting: Obstetrics and Gynecology

## 2020-12-10 VITALS — BP 110/80 | Ht 63.0 in | Wt 118.0 lb

## 2020-12-10 DIAGNOSIS — R399 Unspecified symptoms and signs involving the genitourinary system: Secondary | ICD-10-CM | POA: Diagnosis not present

## 2020-12-10 DIAGNOSIS — B3731 Acute candidiasis of vulva and vagina: Secondary | ICD-10-CM | POA: Diagnosis not present

## 2020-12-10 LAB — POCT WET PREP WITH KOH
Clue Cells Wet Prep HPF POC: NEGATIVE
KOH Prep POC: NEGATIVE
Trichomonas, UA: NEGATIVE
Yeast Wet Prep HPF POC: POSITIVE

## 2020-12-10 LAB — POCT URINALYSIS DIPSTICK
Bilirubin, UA: NEGATIVE
Blood, UA: NEGATIVE
Glucose, UA: NEGATIVE
Ketones, UA: NEGATIVE
Leukocytes, UA: NEGATIVE
Nitrite, UA: NEGATIVE
Protein, UA: NEGATIVE
Spec Grav, UA: 1.02 (ref 1.010–1.025)
pH, UA: 6 (ref 5.0–8.0)

## 2020-12-10 MED ORDER — FLUCONAZOLE 150 MG PO TABS: 150.0000 mg | ORAL_TABLET | Freq: Once | ORAL | 0 refills | Status: AC

## 2020-12-10 NOTE — Progress Notes (Signed)
Patient, No Pcp Per (Inactive)   Chief Complaint  Patient presents with   Vaginal Discharge    Itchiness, irritation   Urinary Tract Infection    Urinary frequency and burning    HPI:      [name] is a 40 y.o. G6P0004 whose LMP was Patient's last menstrual period was 12/03/2020 (approximate)., presents today for increased vag d/c with itching/irritation/swelling recently, also with dyspareunia since sx started. No fishy odor. Hx of yeast and BV in past. No meds to tx, no recent abx use. No new partners. Uses scented soap and wears thongs. No dryer sheets, no change in soaps/detergents.  Also with urinary frequency/urgency and dysuria. No LBP, pelvic pain, fevers, hematuria. Hx of UTI in distant past.    Past Medical History:  Diagnosis Date   Anemia    Hypertension     Past Surgical History:  Procedure Laterality Date   CESAREAN SECTION     CESAREAN SECTION WITH BILATERAL TUBAL LIGATION N/A 11/07/2015   Procedure: CESAREAN SECTION WITH BILATERAL TUBAL LIGATION;  Surgeon: Nadara Mustard, MD;  Location: [facility] ORS;  Service: Obstetrics;  Laterality: N/A;   CHOLECYSTECTOMY      History reviewed. No pertinent family history.  Social History   Socioeconomic History   Marital status: Married    Spouse name: Not on file   Number of children: Not on file   Years of education: Not on file   Highest education level: Not on file  Occupational History   Not on file  Tobacco Use   Smoking status: Former    Packs/day: 1.50    Types: Cigarettes    Quit date: 05/24/2008    Years since quitting: 12.5   Smokeless tobacco: Never  Vaping Use   Vaping Use: Never used  Substance and Sexual Activity   Alcohol use: Yes   Drug use: No   Sexual activity: Yes    Birth control/protection: Surgical    Comment: Tubal Ligation  Other Topics Concern   Not on file  Social History Narrative   Not on file   Social Determinants of Health   Financial Resource Strain: Not on  file  Food Insecurity: Not on file  Transportation Needs: Not on file  Physical Activity: Not on file  Stress: Not on file  Social Connections: Not on file  Intimate Partner Violence: Not on file    Outpatient Medications Prior to Visit  Medication Sig Dispense Refill   clotrimazole-betamethasone (LOTRISONE) cream Apply 1 application topically 2 (two) times daily. 30 g 0   No facility-administered medications prior to visit.      ROS:  Review of Systems  Constitutional:  Negative for fever.  Gastrointestinal:  Negative for blood in stool, constipation, diarrhea, nausea and vomiting.  Genitourinary:  Positive for frequency and vaginal discharge. Negative for dyspareunia, dysuria, flank pain, hematuria, urgency, vaginal bleeding and vaginal pain.  Musculoskeletal:  Negative for back pain.  Skin:  Negative for rash.  BREAST: No symptoms   OBJECTIVE:   Vitals:  BP 110/80   Ht 5\' 3"  (1.6 m)   Wt 118 lb (53.5 kg)   LMP 12/03/2020 (Approximate)   BMI 20.90 kg/m   Physical Exam Vitals reviewed.  Constitutional:      Appearance: She is well-developed.  Pulmonary:     Effort: Pulmonary effort is normal.  Genitourinary:    Pubic Area: No rash.      Labia:  Right: Rash present. No tenderness or lesion.        Left: Rash present. No tenderness or lesion.      Vagina: Vaginal discharge present. No erythema or tenderness.     Cervix: Normal.     Uterus: Normal. Not enlarged and not tender.      Adnexa: Right adnexa normal and left adnexa normal.       Right: No mass or tenderness.         Left: No mass or tenderness.       Comments: BILAT LABIA MAJORA AND MINORA WITH ERYTHEMA, SWELLING, IRRITATION Musculoskeletal:        General: Normal range of motion.     Cervical back: Normal range of motion.  Skin:    General: Skin is warm and dry.  Neurological:     General: No focal deficit present.     Mental Status: She is alert and oriented to person, place, and time.   Psychiatric:        Mood and Affect: Mood normal.        Behavior: Behavior normal.        Thought Content: Thought content normal.        Judgment: Judgment normal.    Results: Results for orders placed or performed in visit on 12/10/20 (from the past 24 hour(s))  POCT Wet Prep with KOH     Status: Abnormal   Collection Time: 12/10/20 11:51 AM  Result Value Ref Range   Trichomonas, UA Negative    Clue Cells Wet Prep HPF POC neg    Epithelial Wet Prep HPF POC     Yeast Wet Prep HPF POC pos    Bacteria Wet Prep HPF POC     RBC Wet Prep HPF POC     WBC Wet Prep HPF POC     KOH Prep POC Negative Negative  POCT Urinalysis Dipstick     Status: Normal   Collection Time: 12/10/20 11:51 AM  Result Value Ref Range   Color, UA yellow    Clarity, UA clear    Glucose, UA Negative Negative   Bilirubin, UA neg    Ketones, UA neg    Spec Grav, UA 1.020 1.010 - 1.025   Blood, UA neg    pH, UA 6.0 5.0 - 8.0   Protein, UA Negative Negative   Urobilinogen, UA     Nitrite, UA neg    Leukocytes, UA Negative Negative   Appearance     Odor       Assessment/Plan: Candidal vaginitis - Plan: fluconazole (DIFLUCAN) 150 MG tablet, POCT Wet Prep with KOH; pos sx and wet prep. Rx diflucan. F/u prn. Change to dove sens skin soap, no thongs.   UTI symptoms - Plan: Urine Culture, POCT Urinalysis Dipstick; neg UA. Check C&S. IF neg, sx most likely due to yeast vag. F/u prn.    Meds ordered this encounter  Medications   fluconazole (DIFLUCAN) 150 MG tablet    Sig: Take 1 tablet (150 mg total) by mouth once for 1 dose. May repeat in 3 days if still having symptoms    Dispense:  2 tablet    Refill:  0    Order Specific Question:   Supervising Provider    Answer:   Nadara Mustard [[id number]]      Return if symptoms worsen or fail to improve.  Samuele Storey B. Noam Karaffa, PA-C 12/10/2020 11:53 AM

## 2020-12-13 LAB — URINE CULTURE

## 2020-12-15 MED ORDER — NITROFURANTOIN MONOHYD MACRO 100 MG PO CAPS: 100.0000 mg | ORAL_CAPSULE | Freq: Two times a day (BID) | ORAL | 0 refills | Status: DC

## 2020-12-15 NOTE — Addendum Note (Signed)
Addended by: Althea Grimmer B on: 12/15/2020 09:08 AM   Modules accepted: Orders

## 2020-12-26 ENCOUNTER — Encounter: Payer: Self-pay | Admitting: Obstetrics and Gynecology

## 2020-12-26 MED ORDER — FLUCONAZOLE 150 MG PO TABS: 150.0000 mg | ORAL_TABLET | Freq: Once | ORAL | 0 refills | Status: AC

## 2020-12-26 MED ORDER — SULFAMETHOXAZOLE-TRIMETHOPRIM 800-160 MG PO TABS: 1.0000 | ORAL_TABLET | Freq: Two times a day (BID) | ORAL | 0 refills | Status: AC

## 2021-02-10 ENCOUNTER — Other Ambulatory Visit (HOSPITAL_COMMUNITY)
Admission: RE | Admit: 2021-02-10 | Discharge: 2021-02-10 | Disposition: A | Discharge status: Home / Self Care | Payer: Medicaid Other | Source: Ambulatory Visit | Attending: Advanced Practice Midwife | Admitting: Advanced Practice Midwife

## 2021-02-10 ENCOUNTER — Encounter: Payer: Self-pay | Admitting: Advanced Practice Midwife

## 2021-02-10 ENCOUNTER — Ambulatory Visit (INDEPENDENT_AMBULATORY_CARE_PROVIDER_SITE_OTHER): Payer: Medicaid Other | Admitting: Advanced Practice Midwife

## 2021-02-10 ENCOUNTER — Other Ambulatory Visit: Payer: Self-pay

## 2021-02-10 VITALS — BP 130/80 | Ht 63.0 in | Wt 115.0 lb

## 2021-02-10 DIAGNOSIS — N930 Postcoital and contact bleeding: Secondary | ICD-10-CM | POA: Diagnosis present

## 2021-02-10 DIAGNOSIS — N898 Other specified noninflammatory disorders of vagina: Secondary | ICD-10-CM

## 2021-02-10 DIAGNOSIS — R350 Frequency of micturition: Secondary | ICD-10-CM | POA: Diagnosis not present

## 2021-02-10 DIAGNOSIS — R3915 Urgency of urination: Secondary | ICD-10-CM | POA: Diagnosis not present

## 2021-02-10 LAB — POCT URINALYSIS DIPSTICK
Bilirubin, UA: NEGATIVE
Blood, UA: NEGATIVE
Glucose, UA: NEGATIVE
Ketones, UA: NEGATIVE
Leukocytes, UA: NEGATIVE
Nitrite, UA: NEGATIVE
Protein, UA: NEGATIVE
Spec Grav, UA: 1.01 (ref 1.010–1.025)
Urobilinogen, UA: 0.2 E.U./dL
pH, UA: 5 (ref 5.0–8.0)

## 2021-02-12 LAB — CERVICOVAGINAL ANCILLARY ONLY
Bacterial Vaginitis (gardnerella): NEGATIVE
Candida Glabrata: NEGATIVE
Candida Vaginitis: POSITIVE — AB
Chlamydia: NEGATIVE
Comment: NEGATIVE
Comment: NEGATIVE
Comment: NEGATIVE
Comment: NEGATIVE
Comment: NEGATIVE
Comment: NORMAL
Neisseria Gonorrhea: NEGATIVE
Trichomonas: NEGATIVE

## 2021-02-13 ENCOUNTER — Other Ambulatory Visit: Payer: Self-pay | Admitting: Advanced Practice Midwife

## 2021-02-13 DIAGNOSIS — B3731 Acute candidiasis of vulva and vagina: Secondary | ICD-10-CM

## 2021-02-13 MED ORDER — FLUCONAZOLE 150 MG PO TABS: 150.0000 mg | ORAL_TABLET | Freq: Once | ORAL | 1 refills | Status: AC

## 2021-02-13 NOTE — Progress Notes (Signed)
Rx diflucan sent to treat yeast infection

## 2021-02-14 ENCOUNTER — Encounter: Payer: Self-pay | Admitting: Advanced Practice Midwife

## 2021-02-14 LAB — URINE CULTURE

## 2021-02-14 NOTE — Progress Notes (Signed)
Patient ID: Carmen Turner, female   DOB: November 20, 1980, 40 y.o.   MRN: [id number]  Reason for Consult: Urinary Tract Infection (Bleeding after intercourse), Dysuria, and Urinary Frequency   Subjective:  Date of Service: 02/10/2021  HPI:  Carmen Turner is a 40 y.o. female being seen for bleeding after intercourse and vaginitis symptoms. In the last 2 weeks she has had 3 episodes of bright red bleeding that soaked her sheet after intercourse. She denies pain, odor or urinary symptoms. She admits thick white discharge and mild vaginal itch. She requests STD testing also. She had a normal PAP smear in April of this year.  Past Medical History:  Diagnosis Date   Anemia    Hypertension    History reviewed. No pertinent family history. Past Surgical History:  Procedure Laterality Date   CESAREAN SECTION     CESAREAN SECTION WITH BILATERAL TUBAL LIGATION N/A 11/07/2015   Procedure: CESAREAN SECTION WITH BILATERAL TUBAL LIGATION;  Surgeon: Gae Dry, MD;  Location: [facility] ORS;  Service: Obstetrics;  Laterality: N/A;   CHOLECYSTECTOMY      Short Social History:  Social History   Tobacco Use   Smoking status: Former    Packs/day: 1.50    Types: Cigarettes    Quit date: 05/24/2008    Years since quitting: 12.7   Smokeless tobacco: Never  Substance Use Topics   Alcohol use: Yes    Allergies  Allergen Reactions   Ibuprofen Swelling    Current Outpatient Medications  Medication Sig Dispense Refill   nitrofurantoin, macrocrystal-monohydrate, (MACROBID) 100 MG capsule Take 1 capsule (100 mg total) by mouth 2 (two) times daily. (Patient not taking: Reported on 02/10/2021) 10 capsule 0   No current facility-administered medications for this visit.    Review of Systems  Constitutional:  Negative for chills and fever.  HENT:  Negative for congestion, ear discharge, ear pain, hearing loss, sinus pain and sore throat.   Eyes:  Negative for blurred vision and double vision.   Respiratory:  Negative for cough, shortness of breath and wheezing.   Cardiovascular:  Negative for chest pain, palpitations and leg swelling.  Gastrointestinal:  Negative for abdominal pain, blood in stool, constipation, diarrhea, heartburn, melena, nausea and vomiting.  Genitourinary:  Negative for dysuria, flank pain, frequency, hematuria and urgency.       Positive for bleeding after intercourse, vaginal discharge and itch  Musculoskeletal:  Negative for back pain, joint pain and myalgias.  Skin:  Negative for itching and rash.  Neurological:  Negative for dizziness, tingling, tremors, sensory change, speech change, focal weakness, seizures, loss of consciousness, weakness and headaches.  Endo/Heme/Allergies:  Negative for environmental allergies. Does not bruise/bleed easily.  Psychiatric/Behavioral:  Negative for depression, hallucinations, memory loss, substance abuse and suicidal ideas. The patient is not nervous/anxious and does not have insomnia.        Objective:  Objective   Vitals:   02/10/21 1526  BP: 130/80  Weight: 115 lb (52.2 kg)  Height: 5\' 3"  (1.6 m)   Body mass index is 20.37 kg/m. Constitutional: Well nourished, well developed female in no acute distress.  HEENT: normal Skin: Warm and dry.  Cardiovascular: Regular rate and rhythm.   Extremity:  no edema   Respiratory: Clear to auscultation bilateral. Normal respiratory effort Neuro: DTRs 2+, Cranial nerves grossly intact Psych: Alert and Oriented x3. No memory deficits. Normal mood and affect.    Pelvic exam:  is not limited by body habitus EGBUS: within normal  limits Vagina: within normal limits and with normal mucosa, no blood in the vault, thick white discharge Cervix: grossly normal appearance, no active bleeding  Update to visit: Data:  Latest Reference Range & Units 02/10/21 15:34 02/10/21 15:51 02/10/21 16:02  Chlamydia   Negative   Neisseria Gonorrhea   Negative   Trichomonas   Negative    Candida Vaginitis   Positive !   Candida Glabrata   Negative   Bacterial Vaginitis (gardnerella)   Negative   Bilirubin, UA  Negative    Glucose Negative  Negative    Ketones, UA  Negative    Leukocytes,UA Negative  Negative    Nitrite, UA  Negative    pH, UA 5.0 - 8.0  5.0    Protein,UA Negative  Negative    Specific Gravity, UA 1.010 - 1.025  1.010    Urobilinogen, UA 0.2 or 1.0 E.U./dL 0.2    RBC, UA  Negative    Organism ID, Bacteria    Comment  URINE CULTURE    Rpt  !: Data is abnormal Rpt: View report in Results Review for more information  Urine Culture, Routine Final report   Organism ID, Bacteria Comment   Comment: Mixed urogenital flora  Less than 10,000 colonies/mL        Assessment/Plan:     40 y.o. female with yeast vaginitis, likely cervical bleeding after intercourse   Aptima: vaginitis/STDs Urine Culture Follow up as needed after labs result: Messages to patient and Rx Diflucan sent   Tresea Mall CNM [facility] 02/14/2021, 12:56 PM

## 2021-03-12 ENCOUNTER — Encounter: Payer: Self-pay | Admitting: Obstetrics and Gynecology

## 2021-03-12 ENCOUNTER — Other Ambulatory Visit: Payer: Self-pay | Admitting: Obstetrics and Gynecology

## 2021-03-12 DIAGNOSIS — B3731 Acute candidiasis of vulva and vagina: Secondary | ICD-10-CM

## 2021-03-12 MED ORDER — FLUCONAZOLE 150 MG PO TABS: 150.0000 mg | ORAL_TABLET | Freq: Once | ORAL | 0 refills | Status: AC

## 2021-03-12 NOTE — Progress Notes (Signed)
Rx RF diflucan for yeast vag sx 

## 2021-03-25 ENCOUNTER — Encounter: Payer: Self-pay | Admitting: Obstetrics and Gynecology

## 2021-03-26 ENCOUNTER — Ambulatory Visit: Payer: Medicaid Other | Admitting: Licensed Practical Nurse

## 2021-03-27 ENCOUNTER — Ambulatory Visit: Payer: Medicaid Other | Admitting: Obstetrics and Gynecology

## 2021-03-27 NOTE — Progress Notes (Deleted)
° ° °  Patient, No Pcp Per (Inactive)   No chief complaint on file.   HPI:      [name] is a 41 y.o. G6P0004 whose LMP was No LMP recorded., presents today for ***    Patient Active Problem List   Diagnosis Date Noted   Cesarean deliv due to previous difficult deliv, deliv, curr hospitaliz 11/07/2015    Past Surgical History:  Procedure Laterality Date   CESAREAN SECTION     CESAREAN SECTION WITH BILATERAL TUBAL LIGATION N/A 11/07/2015   Procedure: CESAREAN SECTION WITH BILATERAL TUBAL LIGATION;  Surgeon: Nadara Mustard, MD;  Location: [facility] ORS;  Service: Obstetrics;  Laterality: N/A;   CHOLECYSTECTOMY      No family history on file.  Social History   Socioeconomic History   Marital status: Married    Spouse name: Not on file   Number of children: Not on file   Years of education: Not on file   Highest education level: Not on file  Occupational History   Not on file  Tobacco Use   Smoking status: Former    Packs/day: 1.50    Types: Cigarettes    Quit date: 05/24/2008    Years since quitting: 12.8   Smokeless tobacco: Never  Vaping Use   Vaping Use: Never used  Substance and Sexual Activity   Alcohol use: Yes   Drug use: No   Sexual activity: Yes    Birth control/protection: Surgical    Comment: Tubal Ligation  Other Topics Concern   Not on file  Social History Narrative   Not on file   Social Determinants of Health   Financial Resource Strain: Not on file  Food Insecurity: Not on file  Transportation Needs: Not on file  Physical Activity: Not on file  Stress: Not on file  Social Connections: Not on file  Intimate Partner Violence: Not on file    Outpatient Medications Prior to Visit  Medication Sig Dispense Refill   nitrofurantoin, macrocrystal-monohydrate, (MACROBID) 100 MG capsule Take 1 capsule (100 mg total) by mouth 2 (two) times daily. (Patient not taking: Reported on 02/10/2021) 10 capsule 0   No facility-administered medications  prior to visit.      ROS:  Review of Systems BREAST: No symptoms   OBJECTIVE:   Vitals:  There were no vitals taken for this visit.  Physical Exam  Results: No results found for this or any previous visit (from the past 24 hour(s)).   Assessment/Plan: No diagnosis found.    No orders of the defined types were placed in this encounter.     No follow-ups on file.  Harini Dearmond B. Alexiz Cothran, PA-C 03/27/2021 9:45 AM

## 2021-04-07 ENCOUNTER — Ambulatory Visit: Payer: Medicaid Other | Admitting: Obstetrics and Gynecology

## 2021-04-18 ENCOUNTER — Ambulatory Visit: Payer: Medicaid Other | Admitting: Advanced Practice Midwife

## 2021-05-14 ENCOUNTER — Other Ambulatory Visit (HOSPITAL_COMMUNITY)
Admission: RE | Admit: 2021-05-14 | Discharge: 2021-05-14 | Disposition: A | Discharge status: Home / Self Care | Payer: Medicaid Other | Source: Ambulatory Visit | Attending: Obstetrics | Admitting: Obstetrics

## 2021-05-14 ENCOUNTER — Ambulatory Visit (INDEPENDENT_AMBULATORY_CARE_PROVIDER_SITE_OTHER): Payer: Medicaid Other | Admitting: Obstetrics

## 2021-05-14 ENCOUNTER — Other Ambulatory Visit: Payer: Self-pay

## 2021-05-14 VITALS — BP 118/74 | Ht 63.0 in | Wt 117.0 lb

## 2021-05-14 DIAGNOSIS — Z113 Encounter for screening for infections with a predominantly sexual mode of transmission: Secondary | ICD-10-CM

## 2021-05-14 DIAGNOSIS — N898 Other specified noninflammatory disorders of vagina: Secondary | ICD-10-CM | POA: Diagnosis not present

## 2021-05-14 LAB — POCT WET PREP (WET MOUNT): Trichomonas Wet Prep HPF POC: ABSENT

## 2021-05-14 NOTE — Progress Notes (Signed)
[name] is a 41 y.o. [id number] who LMP was Patient's last menstrual period was 05/07/2021 (exact date)., presents today for a problem visit.   Patient complains of an abnormal vaginal discharge for approximately 3 days. Discharge described as: white. Vaginal symptoms include burning.   Other associated symptoms: vulvar itching.Menstrual pattern: She had been bleeding regularly. Contraception: tubal ligation she does not use barrier protection.  She denies recent antibiotic exposure, denies changes in soaps, detergents coinciding with the onset of her symptoms.  She has not previously self treated or been under treatment by another provider for these symptoms. She has had one new sex partner whom she expresses concern for exposure to STIs. She states she did not feel coerced or forced to have sex, endorses feeling safe in home. Patient has had 3 new sex partners in past year.  ? ?Review of Systems  ?Constitutional: Negative.   ?HENT: Negative.    ?Eyes: Negative.   ?Respiratory: Negative.    ?Cardiovascular: Negative.   ?Gastrointestinal: Negative.   ?Genitourinary: Negative.   ?     Reports itching and burning of vulva. Does report increased thick, white discharge.   ?Musculoskeletal: Negative.   ?Skin: Negative.   ?Neurological: Negative.   ?Endo/Heme/Allergies: Negative.   ?Psychiatric/Behavioral: Negative.     ? ?Objective: ?GU:  ?Vulva: No edema, erythema or irritation noted externally. No signs of increased discharge noted. Swabs collected for Aptima and wet mount.  ?No evidence of altered vaginal flora observed on wet mount.  ? ?Plan: ? ?1) Risk factors for bacterial vaginosis and candida infections discussed.  We discussed normal vaginal flora/microbiome.  Any factors that may alter the microbiome increase the risk of these opportunistic infections.  These include changes in pH, antibiotic exposures, diabetes, wet bathing suits etc.  We discussed that treatment is aimed at eradicating abnormal  bacterial overgrowth and or yeast.  There may be some role for vaginal probiotics in restoring normal vaginal flora.    ? ?2) Aptima swab collected today. Results will be communicated in myChart. No evidence of altered vaginal flora seen on wet mount.  ? ?3) Counseling regarding STI risk and use of protection. Barrier protection emphasized as protective against STIs even though she is contracepting because of BTL, she is not protected against STIs with this method.  ? ?Conan Bowens, SNM ? ?Imagene Riches, CNM  ?05/14/2021 12:41 PM  ? ?

## 2021-05-16 LAB — CERVICOVAGINAL ANCILLARY ONLY
Bacterial Vaginitis (gardnerella): POSITIVE — AB
Candida Glabrata: NEGATIVE
Candida Vaginitis: POSITIVE — AB
Chlamydia: NEGATIVE
Comment: NEGATIVE
Comment: NEGATIVE
Comment: NEGATIVE
Comment: NEGATIVE
Comment: NEGATIVE
Comment: NORMAL
Neisseria Gonorrhea: NEGATIVE
Trichomonas: NEGATIVE

## 2021-05-19 ENCOUNTER — Encounter: Payer: Self-pay | Admitting: Obstetrics

## 2021-05-19 ENCOUNTER — Other Ambulatory Visit: Payer: Self-pay | Admitting: Obstetrics

## 2021-05-19 DIAGNOSIS — B3731 Acute candidiasis of vulva and vagina: Secondary | ICD-10-CM

## 2021-05-19 DIAGNOSIS — B9689 Other specified bacterial agents as the cause of diseases classified elsewhere: Secondary | ICD-10-CM

## 2021-05-19 MED ORDER — FLUCONAZOLE 150 MG PO TABS: 150.0000 mg | ORAL_TABLET | Freq: Once | ORAL | 0 refills | Status: AC

## 2021-05-19 MED ORDER — METRONIDAZOLE 500 MG PO TABS: 500.0000 mg | ORAL_TABLET | Freq: Two times a day (BID) | ORAL | 0 refills | Status: AC

## 2021-05-19 NOTE — Progress Notes (Signed)
Aptima swab shows BV and yeast. Rxs sent to pharmacy, and the patient is notified via My Chart. ? ?Mirna Mires, CNM  ?05/19/2021 10:31 PM  ? ?

## 2021-05-21 ENCOUNTER — Telehealth: Payer: Self-pay

## 2021-05-21 NOTE — Telephone Encounter (Signed)
Pt calling; has seen on MyChart that she has BV and yeast; hasn't heard anything; ?rx.  [phone]  Adv pt [facility] has rx'd medication for BV and yeast; [facility] sent msg in [place]; pt states she has already p/u. ?

## 2021-06-10 DIAGNOSIS — F4323 Adjustment disorder with mixed anxiety and depressed mood: Secondary | ICD-10-CM | POA: Insufficient documentation

## 2021-08-07 ENCOUNTER — Ambulatory Visit (INDEPENDENT_AMBULATORY_CARE_PROVIDER_SITE_OTHER): Payer: Medicaid Other | Admitting: Obstetrics

## 2021-08-07 ENCOUNTER — Encounter: Payer: Self-pay | Admitting: Obstetrics

## 2021-08-07 VITALS — BP 110/80 | Resp 16 | Wt 123.8 lb

## 2021-08-07 DIAGNOSIS — R300 Dysuria: Secondary | ICD-10-CM | POA: Diagnosis not present

## 2021-08-07 DIAGNOSIS — N393 Stress incontinence (female) (male): Secondary | ICD-10-CM | POA: Diagnosis not present

## 2021-08-07 DIAGNOSIS — N8189 Other female genital prolapse: Secondary | ICD-10-CM | POA: Diagnosis not present

## 2021-08-07 DIAGNOSIS — N898 Other specified noninflammatory disorders of vagina: Secondary | ICD-10-CM

## 2021-08-07 LAB — POCT URINALYSIS DIPSTICK
Bilirubin, UA: NEGATIVE
Blood, UA: NEGATIVE
Glucose, UA: NEGATIVE
Ketones, UA: NEGATIVE
Leukocytes, UA: NEGATIVE
Nitrite, UA: NEGATIVE
Protein, UA: NEGATIVE
Spec Grav, UA: >=1.03 — AB (ref 1.010–1.025)
Urobilinogen, UA: 0.2 E.U./dL
pH, UA: 5 (ref 5.0–8.0)

## 2021-08-07 MED ORDER — FLUCONAZOLE 150 MG PO TABS: 150.0000 mg | ORAL_TABLET | Freq: Once | ORAL | 0 refills | Status: AC

## 2021-08-07 NOTE — Patient Instructions (Signed)
Have a great year! Please call with any concerns. Don't forget to wear your seatbelt everyday! If you are not signed up on MyChart, please ask [place] how to sign up for it!   In a world where you can be anything, please be kind.   Body mass index is 21.93 kg/m.  A Healthy Lifestyle: Care Instructions Your Care Instructions  A healthy lifestyle can help you feel good, stay at a healthy weight, and have plenty of energy for both work and play. A healthy lifestyle is something you can share with your whole family. A healthy lifestyle also can lower your risk for serious health problems, such as high blood pressure, heart disease, and diabetes. You can follow a few steps listed below to improve your health and the health of your family. Follow-up care is a key part of your treatment and safety. Be sure to make and go to all appointments, and call your doctor if you are having problems. It's also a good idea to know your test results and keep a list of the medicines you take. How can you care for yourself at home? Do not eat too much sugar, fat, or fast foods. You can still have dessert and treats now and then. The goal is moderation. Start small to improve your eating habits. Pay attention to portion sizes, drink less juice and soda pop, and eat more fruits and vegetables. Eat a healthy amount of food. A 3-ounce serving of meat, for example, is about the size of a deck of cards. Fill the rest of your plate with vegetables and whole grains. Limit the amount of soda and sports drinks you have every day. Drink more water when you are thirsty. Eat at least 5 servings of fruits and vegetables every day. It may seem like a lot, but it is not hard to reach this goal. A serving or helping is 1 piece of fruit, 1 cup of vegetables, or 2 cups of leafy, raw vegetables. Have an apple or some carrot sticks as an afternoon snack instead of a candy bar. Try to have fruits and/or vegetables at every meal. Make exercise  part of your daily routine. You may want to start with simple activities, such as walking, bicycling, or slow swimming. Try to be active 30 to 60 minutes every day. You do not need to do all 30 to 60 minutes all at once. For example, you can exercise 3 times a day for 10 or 20 minutes. Moderate exercise is safe for most people, but it is always a good idea to talk to your doctor before starting an exercise program. Keep moving. Mow the lawn, work in the garden, or [employer]. Take the stairs instead of the elevator at work. If you smoke, quit. People who smoke have an increased risk for heart attack, stroke, cancer, and other lung illnesses. Quitting is hard, but there are ways to boost your chance of quitting tobacco for good. Use nicotine gum, patches, or lozenges. Ask your doctor about stop-smoking programs and medicines. Keep trying. In addition to reducing your risk of diseases in the future, you will notice some benefits soon after you stop using tobacco. If you have shortness of breath or asthma symptoms, they will likely get better within a few weeks after you quit. Limit how much alcohol you drink. Moderate amounts of alcohol (up to 2 drinks a day for men, 1 drink a day for women) are okay. But drinking too much can lead to  liver problems, high blood pressure, and other health problems. Family health If you have a family, there are many things you can do together to improve your health. Eat meals together as a family as often as possible. Eat healthy foods. This includes fruits, vegetables, lean meats and dairy, and whole grains. Include your family in your fitness plan. Most people think of activities such as jogging or tennis as the way to fitness, but there are many ways you and your family can be more active. Anything that makes you breathe hard and gets your heart pumping is exercise. Here are some tips: Walk to do errands or to take your child to school or the bus. Go for a family  bike ride after dinner instead of watching TV. Care instructions adapted under license by your healthcare professional. This care instruction is for use with your licensed healthcare professional. If you have questions about a medical condition or this instruction, always ask your healthcare professional. [place] any warranty or liability for your use of this information.

## 2021-08-07 NOTE — Progress Notes (Signed)
Chief Complaint  Patient presents with   Urinary Frequency    Patient comes in office today with concerns of urinary frequency x 2 weeks. Patient reports dysuria but denies back pain, nausea, vomiting, fever or hematuria. Patient reports concern of uterine prolapse she states physician had mentioned to her in past and she would like examination today to see if it is prolapsed   Patient Carmen Turner is an 41 y.o. year old G6P0004 Patient's last menstrual period was 07/26/2021. currently BTL for contraception who presents with complaints of urinary frequency, urgency and dysuria for 14 days. Patient denies flank pain, fever, chills, abnormal bleeding. Patient complains of increased vaginal discharge but denies odor, burning; there has been some itching. Patient also feels prolapse which was noted on past exam.  Pt reports pressure, frequency and constipation x 1 mo. She also reports leaking urine with sneezing, coughing, laughing. She dies bit require(s) wearing a pad daily due to the leaking.  Not leaking with urgency. She reports no need for vaginal splinting with bowel movements.  She  denies fecal incontinence, no flatal incontinence.  She is not taking hormones.   Review of Systems  Constitutional:  Positive for fatigue. Negative for activity change, appetite change, chills, diaphoresis, fever and unexpected weight change.  HENT:  Negative for congestion, dental problem, drooling, ear discharge, ear pain, facial swelling, hearing loss, mouth sores, nosebleeds, postnasal drip, rhinorrhea, sinus pressure, sinus pain, sneezing, sore throat, tinnitus, trouble swallowing and voice change.   Eyes:  Negative for photophobia, pain, discharge, redness, itching and visual disturbance.  Respiratory:  Negative for apnea, cough, choking, chest tightness, shortness of breath, wheezing and stridor.   Cardiovascular:  Negative for chest pain, palpitations and leg swelling.  Gastrointestinal:  Positive for  constipation. Negative for abdominal distention, abdominal pain, anal bleeding, blood in stool, diarrhea, nausea, rectal pain and vomiting.  Endocrine: Negative for cold intolerance, heat intolerance, polydipsia, polyphagia and polyuria.  Genitourinary:  Positive for frequency and vaginal discharge. Negative for decreased urine volume, difficulty urinating, dyspareunia, dysuria, enuresis, flank pain, genital sores, hematuria, menstrual problem, pelvic pain, urgency, vaginal bleeding and vaginal pain.  Musculoskeletal:  Negative for arthralgias, back pain, gait problem, joint swelling, myalgias, neck pain and neck stiffness.  Skin:  Negative for color change, pallor, rash and wound.  Allergic/Immunologic: Negative for environmental allergies, food allergies and immunocompromised state.  Neurological:  Negative for dizziness, tremors, seizures, syncope, facial asymmetry, speech difficulty, weakness, light-headedness, numbness and headaches.  Hematological:  Negative for adenopathy. Does not bruise/bleed easily.  Psychiatric/Behavioral:  Negative for agitation, behavioral problems, confusion, decreased concentration, dysphoric mood, hallucinations, self-injury, sleep disturbance and suicidal ideas. The patient is not nervous/anxious and is not hyperactive.    Period Cycle (Days): 28 Period Duration (Days): 5 Period Pattern: Regular Dysmenorrhea: None   Past Medical History:  Diagnosis Date   Anemia    Hypertension    Past Surgical History:  Procedure Laterality Date   CESAREAN SECTION     CESAREAN SECTION WITH BILATERAL TUBAL LIGATION N/A 11/07/2015   Procedure: CESAREAN SECTION WITH BILATERAL TUBAL LIGATION;  Surgeon: Nadara Mustard, MD;  Location: [facility] ORS;  Service: Obstetrics;  Laterality: N/A;   CHOLECYSTECTOMY     History reviewed. No pertinent family history. Social History   Socioeconomic History   Marital status: Legally Separated    Spouse name: Not on file   Number of  children: Not on file   Years of education: Not on file   Highest education  level: Not on file  Occupational History   Not on file  Tobacco Use   Smoking status: Former    Packs/day: 1.50    Types: Cigarettes    Quit date: 05/24/2008    Years since quitting: 13.2   Smokeless tobacco: Never  Vaping Use   Vaping Use: Never used  Substance and Sexual Activity   Alcohol use: Yes   Drug use: No   Sexual activity: Yes    Birth control/protection: Surgical    Comment: Tubal Ligation  Other Topics Concern   Not on file  Social History Narrative   Not on file   Social Determinants of Health   Financial Resource Strain: Not on file  Food Insecurity: Not on file  Transportation Needs: Not on file  Physical Activity: Not on file  Stress: Not on file  Social Connections: Not on file  Intimate Partner Violence: Not on file     Medicine list and allergies reviewed and updated.    Objective:  BP 110/80   Resp 16   Wt 123 lb 12.8 oz (56.2 kg)   LMP 07/26/2021   BMI 21.93 kg/m  Physical Exam Vitals reviewed. Exam conducted with a chaperone present.  Constitutional:      General: She is not in acute distress.    Appearance: Normal appearance. She is not ill-appearing.  HENT:     Head: Normocephalic and atraumatic.  Eyes:     Extraocular Movements: Extraocular movements intact.  Cardiovascular:     Rate and Rhythm: Normal rate.  Pulmonary:     Effort: Pulmonary effort is normal. No respiratory distress.  Genitourinary:    General: Normal vulva.     Exam position: Lithotomy position.     Pubic Area: No rash.      Labia:        Right: No rash, tenderness or lesion.        Left: No rash, tenderness or lesion.      Vagina: Normal. No vaginal discharge, tenderness or lesions.     Cervix: No discharge, friability, lesion or cervical bleeding.     Uterus: Normal. Not enlarged, not tender and no uterine prolapse.      Adnexa:        Right: No tenderness or fullness.          Left: No tenderness or fullness.       Comments: At most grade 1 cystocele and grade 1 rectocele- pelvic relaxation noted. No uterine descent with valsalva.  Musculoskeletal:        General: Normal range of motion.  Lymphadenopathy:     Lower Body: No right inguinal adenopathy. No left inguinal adenopathy.  Skin:    General: Skin is warm.  Neurological:     General: No focal deficit present.     Mental Status: She is alert and oriented to person, place, and time. Mental status is at baseline.     Coordination: Coordination normal.  Psychiatric:        Mood and Affect: Mood normal.        Behavior: Behavior normal.        Thought Content: Thought content normal.        Judgment: Judgment normal.    ASSESSMENT: Dysuria - Plan: POCT urinalysis dipstick, Urine Culture  Vaginal discharge - Plan: Mycoplasma / Ureaplasma Culture, NuSwab Vaginitis Plus (VG+), fluconazole (DIFLUCAN) 150 MG tablet  Pelvic relaxation  Stress incontinence  PLAN:  Urine dipstick negative Urine culture sent.  Prescription  done for yeast for symptoms. STI cultures obtained including mycoplasma.  Recommend patient increase water intake. She may use Pyridium/Azo over the counter as needed. Patient is instructed to call or return to clinic if these symptoms worsen or fail to improve as anticipated. No significant prolapse noted on exam.  Options for incontinence discussed with patient. Discussed Kegel's exercises and pelvic floor rehab. Discussed urology referral for sling for stress incontinence ; she desires.   Return if symptoms worsen or fail to improve, for annual/well woman.

## 2021-08-09 LAB — URINE CULTURE: Organism ID, Bacteria: NO GROWTH

## 2021-08-13 LAB — NUSWAB VAGINITIS PLUS (VG+)
Candida albicans, NAA: NEGATIVE
Candida glabrata, NAA: NEGATIVE
Chlamydia trachomatis, NAA: NEGATIVE
Neisseria gonorrhoeae, NAA: NEGATIVE
Trich vag by NAA: NEGATIVE

## 2021-08-13 LAB — MYCOPLASMA / UREAPLASMA CULTURE
Mycoplasma hominis Culture: POSITIVE — AB
Ureaplasma urealyticum: POSITIVE — AB

## 2021-08-14 ENCOUNTER — Telehealth: Payer: Self-pay

## 2021-08-14 ENCOUNTER — Other Ambulatory Visit: Payer: Self-pay | Admitting: Licensed Practical Nurse

## 2021-08-14 DIAGNOSIS — A493 Mycoplasma infection, unspecified site: Secondary | ICD-10-CM

## 2021-08-14 MED ORDER — DOXYCYCLINE HYCLATE 100 MG PO CAPS: 100.0000 mg | ORAL_CAPSULE | Freq: Two times a day (BID) | ORAL | 0 refills | Status: DC

## 2021-08-14 NOTE — Telephone Encounter (Signed)
Patient had contacted office to inquire about abnormal lab result and wanted to know what she should do? Reviewing labs it looks like patient saw Dr. Okey Dupre on 08/07/21 with complaints of dysuria and discharge, mycoplasma/ureaplasma culture has come back positive. Please review results and advise, Dr. Okey Dupre is not in office until 08/15/21 will forward message to her as well to review. Thanks. KW

## 2021-08-14 NOTE — Telephone Encounter (Signed)
Contacted patient back and advised that this organism lives in genital tract and can be present for long periods of time. Patient advised to start antibiotic prescribed and to have partner tested. KW

## 2021-08-14 NOTE — Telephone Encounter (Signed)
Patient contacted office back and I advised her of your message below, patient wants clarification of what STD this is? KW

## 2021-08-15 MED ORDER — MOXIFLOXACIN HCL 400 MG PO TABS: 400.0000 mg | ORAL_TABLET | Freq: Every day | ORAL | 0 refills | Status: AC

## 2021-08-15 NOTE — Telephone Encounter (Signed)
See result note.  

## 2021-10-03 ENCOUNTER — Ambulatory Visit (INDEPENDENT_AMBULATORY_CARE_PROVIDER_SITE_OTHER): Payer: Medicaid Other

## 2021-10-03 VITALS — Wt 126.2 lb

## 2021-10-03 DIAGNOSIS — M545 Low back pain, unspecified: Secondary | ICD-10-CM

## 2021-10-03 LAB — POCT URINALYSIS DIPSTICK
Bilirubin, UA: NEGATIVE
Blood, UA: NEGATIVE
Glucose, UA: NEGATIVE
Ketones, UA: NEGATIVE
Leukocytes, UA: NEGATIVE
Nitrite, UA: NEGATIVE
Protein, UA: NEGATIVE
Spec Grav, UA: 1.015 (ref 1.010–1.025)
Urobilinogen, UA: 0.2 E.U./dL
pH, UA: 6.5 (ref 5.0–8.0)

## 2021-10-03 NOTE — Progress Notes (Signed)
Chief Complaint  Patient presents with   Back Pain    Patient presented in office today with concern of UTI like symptoms. Patient reports for the past weeks she has had pelvic tenderness, lower back pain and heavy vaginal discharge that she states " pours out of me."POCT U/A was normal in office, will send for culture and recommend patient schedule visit with MD to address symptoms.

## 2021-10-05 LAB — URINE CULTURE

## 2021-10-06 ENCOUNTER — Other Ambulatory Visit (HOSPITAL_COMMUNITY)
Admission: RE | Admit: 2021-10-06 | Discharge: 2021-10-06 | Disposition: A | Discharge status: Home / Self Care | Payer: Medicaid Other | Source: Ambulatory Visit | Attending: Obstetrics & Gynecology | Admitting: Obstetrics & Gynecology

## 2021-10-06 ENCOUNTER — Ambulatory Visit (INDEPENDENT_AMBULATORY_CARE_PROVIDER_SITE_OTHER): Payer: Medicaid Other | Admitting: Obstetrics & Gynecology

## 2021-10-06 VITALS — BP 100/60 | Wt 124.0 lb

## 2021-10-06 DIAGNOSIS — Z202 Contact with and (suspected) exposure to infections with a predominantly sexual mode of transmission: Secondary | ICD-10-CM | POA: Insufficient documentation

## 2021-10-06 DIAGNOSIS — N898 Other specified noninflammatory disorders of vagina: Secondary | ICD-10-CM | POA: Insufficient documentation

## 2021-10-08 ENCOUNTER — Other Ambulatory Visit: Payer: Medicaid Other

## 2021-10-08 LAB — CERVICOVAGINAL ANCILLARY ONLY
Bacterial Vaginitis (gardnerella): NEGATIVE
Candida Glabrata: NEGATIVE
Candida Vaginitis: NEGATIVE
Chlamydia: NEGATIVE
Comment: NEGATIVE
Comment: NEGATIVE
Comment: NEGATIVE
Comment: NEGATIVE
Comment: NEGATIVE
Comment: NORMAL
Neisseria Gonorrhea: NEGATIVE
Trichomonas: NEGATIVE

## 2021-10-09 LAB — HEP, RPR, HIV PANEL
HIV Screen 4th Generation wRfx: NONREACTIVE
Hepatitis B Surface Ag: NEGATIVE
RPR Ser Ql: NONREACTIVE

## 2021-10-13 ENCOUNTER — Ambulatory Visit: Payer: Self-pay | Admitting: Urology

## 2021-10-13 ENCOUNTER — Ambulatory Visit (INDEPENDENT_AMBULATORY_CARE_PROVIDER_SITE_OTHER): Payer: Medicaid Other | Admitting: Urology

## 2021-10-13 ENCOUNTER — Encounter: Payer: Self-pay | Admitting: Urology

## 2021-10-13 VITALS — BP 125/84 | HR 59 | Ht 63.0 in | Wt 121.0 lb

## 2021-10-13 DIAGNOSIS — N393 Stress incontinence (female) (male): Secondary | ICD-10-CM

## 2021-10-13 DIAGNOSIS — N8111 Cystocele, midline: Secondary | ICD-10-CM

## 2021-10-13 LAB — MICROSCOPIC EXAMINATION

## 2021-10-13 LAB — URINALYSIS, COMPLETE
Bilirubin, UA: NEGATIVE
Glucose, UA: NEGATIVE
Ketones, UA: NEGATIVE
Leukocytes,UA: NEGATIVE
Nitrite, UA: NEGATIVE
Protein,UA: NEGATIVE
Specific Gravity, UA: 1.025 (ref 1.005–1.030)
Urobilinogen, Ur: 0.2 mg/dL (ref 0.2–1.0)
pH, UA: 6.5 (ref 5.0–7.5)

## 2021-10-13 NOTE — Progress Notes (Signed)
10/13/2021 8:42 AM   Carmen Turner 11-Aug-1980 [id number]  Referring provider: Horald Pollen, MD [address] [place],  [place] [address]  Chief Complaint  Patient presents with   New Patient (Initial Visit)   Urinary Incontinence    HPI: I was consulted to assess the patient for possible prolapse.  She has mild vaginal bulging.  She rarely leaks with coughing sneezing.  No urge incontinence or bedwetting.  No pads  She voids every 2 hours gets up twice at night and voids with good flow  She has not had a hysterectomy.  No history of kidney stones bladder surgery or bladder infections.  No neurologic issues.  Bowel movements normal     PMH: Past Medical History:  Diagnosis Date   Anemia    Hypertension     Surgical History: Past Surgical History:  Procedure Laterality Date   CESAREAN SECTION     CESAREAN SECTION WITH BILATERAL TUBAL LIGATION N/A 11/07/2015   Procedure: CESAREAN SECTION WITH BILATERAL TUBAL LIGATION;  Surgeon: Nadara Mustard, MD;  Location: [facility] ORS;  Service: Obstetrics;  Laterality: N/A;   CHOLECYSTECTOMY      Home Medications:  Allergies as of 10/13/2021       Reactions   Ibuprofen Swelling        Medication List        Accurate as of October 13, 2021  8:42 AM. If you have any questions, ask your nurse or doctor.          STOP taking these medications    doxycycline 100 MG capsule Commonly known as: VIBRAMYCIN Stopped by: Martina Sinner, MD       TAKE these medications    predniSONE 10 MG tablet Commonly known as: DELTASONE Take 10 mg by mouth daily.   sertraline 50 MG tablet Commonly known as: ZOLOFT Take 50 mg by mouth daily.   valACYclovir 1000 MG tablet Commonly known as: VALTREX Take 1,000 mg by mouth 3 (three) times daily.        Allergies:  Allergies  Allergen Reactions   Ibuprofen Swelling    Family History: No family history on file.  Social History:  reports that she quit  smoking about 13 years ago. Her smoking use included cigarettes. She smoked an average of 1.5 packs per day. She has been exposed to tobacco smoke. She has never used smokeless tobacco. She reports current alcohol use. She reports that she does not use drugs.  ROS:                                        Physical Exam: BP 125/84   Pulse (!) 59   Ht 5\' 3"  (1.6 m)   Wt 54.9 kg   BMI 21.43 kg/m   Constitutional:  Alert and oriented, No acute distress. HEENT: [place] AT, moist mucus membranes.  Trachea midline, no masses. Cardiovascular: No clubbing, cyanosis, or edema. Respiratory: Normal respiratory effort, no increased work of breathing. GI: Abdomen is soft, nontender, nondistended, no abdominal masses GU: Examination patient had minimal prolapse at rest.  She did have some descensus of the anterior vaginal wall with a very small grade 2 or grade 1 cystocele with minimal central defect.  She had a small distal grade 1 rectocele.  Uterus reasonably well supported may have distended 2 cm no stress incontinence. Skin: No rashes, bruises or suspicious lesions. Lymph: No  cervical or inguinal adenopathy. Neurologic: Grossly intact, no focal deficits, moving all 4 extremities. Psychiatric: Normal mood and affect.  Laboratory Data: Lab Results  Component Value Date   WBC 7.1 11/08/2015   HGB 9.9 (L) 11/08/2015   HCT 28.3 (L) 11/08/2015   MCV 87.7 11/08/2015   PLT 191 11/08/2015    Lab Results  Component Value Date   CREATININE 0.93 12/08/2014    No results found for: "PSA"  No results found for: "TESTOSTERONE"  No results found for: "HGBA1C"  Urinalysis    Component Value Date/Time   COLORURINE Yellow 05/25/2014 1944   APPEARANCEUR Turbid 05/25/2014 1944   LABSPEC 1.017 05/25/2014 1944   PHURINE 8.0 05/25/2014 1944   GLUCOSEU Negative 05/25/2014 1944   HGBUR Negative 05/25/2014 1944   BILIRUBINUR negative 10/03/2021 1501   BILIRUBINUR Negative 05/25/2014  1944   KETONESUR Negative 05/25/2014 1944   PROTEINUR Negative 10/03/2021 1501   PROTEINUR 30 mg/dL 78/29/5621 3086   UROBILINOGEN 0.2 10/03/2021 1501   NITRITE negative 10/03/2021 1501   NITRITE Negative 05/25/2014 1944   LEUKOCYTESUR Negative 10/03/2021 1501   LEUKOCYTESUR Trace 05/25/2014 1944    Pertinent Imaging: Urine reviewed  Assessment & Plan: Picture was drawn.  She has intermittent mild symptoms.  She really does not have a lot of defect today to fix and to have a hysterectomy and A&P repair would be quite an undertaking for her mild intermittent symptoms.  She really does not have a lot of findings today.  Natural history discussed.  Physical therapy for mild prolapse symptoms discussed.  She was happy with reassurance.  I will see her as needed  There are no diagnoses linked to this encounter.  No follow-ups on file.  Martina Sinner, MD  [facility] [address], [address] [place], [place] [address] [phone]

## 2021-10-13 NOTE — Addendum Note (Signed)
Addended by: Rendy Lazard L on: 10/13/2021 10:41 AM   Modules accepted: Orders  

## 2021-10-16 LAB — CULTURE, URINE COMPREHENSIVE

## 2021-10-17 ENCOUNTER — Telehealth: Payer: Self-pay | Admitting: *Deleted

## 2021-10-17 MED ORDER — NITROFURANTOIN MACROCRYSTAL 100 MG PO CAPS: 100.0000 mg | ORAL_CAPSULE | Freq: Two times a day (BID) | ORAL | 0 refills | Status: AC

## 2021-10-17 NOTE — Telephone Encounter (Signed)
Macrodantin 100 mg bid for 7 days    Spoke with patient and advised results rx sent to pharmacy by e-script

## 2021-10-17 NOTE — Telephone Encounter (Signed)
-----   Message from Alfredo Martinez, MD sent at 10/16/2021 11:59 AM EDT ----- Macrodantin 100 mg bid for 7 days ----- Message ----- From: Sueanne Margarita, CMA Sent: 10/16/2021   8:54 AM EDT To: Alfredo Martinez, MD   ----- Message ----- From: Interface, [facility] Lab Results In Sent: 10/13/2021   4:36 PM EDT To: [facility]

## 2021-11-24 ENCOUNTER — Encounter: Payer: Self-pay | Admitting: Obstetrics & Gynecology

## 2021-11-24 NOTE — Progress Notes (Signed)
  Patient Carmen Turner 41 yo, [id number], female  presents for sexually transmitted disease check.   Patient comes in office today with concerns of PID. Patient reports pelvic pain, heavy discharge that is white/clear in color   Sexual history reviewed with the patient. STD exposure: denies knowledge of risky exposure.  Previous history of STD:  none. Current symptoms include vaginal discharge: white, pelvic pain: fairly severe, abnormal bleeding.    Ordered: Cervicovaginal ancillary only             HEP, RPR, HIV Panel  Return as scheduled  Rosario Adie, MD  11/24/2021 3:35 AM

## 2022-02-06 ENCOUNTER — Ambulatory Visit: Payer: Medicaid Other

## 2022-02-12 ENCOUNTER — Other Ambulatory Visit (HOSPITAL_COMMUNITY)
Admission: RE | Admit: 2022-02-12 | Discharge: 2022-02-12 | Disposition: A | Discharge status: Home / Self Care | Payer: Medicaid Other | Source: Ambulatory Visit | Attending: Obstetrics and Gynecology | Admitting: Obstetrics and Gynecology

## 2022-02-12 ENCOUNTER — Ambulatory Visit (INDEPENDENT_AMBULATORY_CARE_PROVIDER_SITE_OTHER): Payer: Medicaid Other

## 2022-02-12 VITALS — BP 132/80 | HR 67 | Temp 98.0°F | Ht 64.0 in | Wt 122.6 lb

## 2022-02-12 DIAGNOSIS — N898 Other specified noninflammatory disorders of vagina: Secondary | ICD-10-CM

## 2022-02-12 DIAGNOSIS — R350 Frequency of micturition: Secondary | ICD-10-CM

## 2022-02-12 LAB — POCT URINALYSIS DIPSTICK
Bilirubin, UA: NEGATIVE
Blood, UA: NEGATIVE
Glucose, UA: NEGATIVE
Ketones, UA: NEGATIVE
Leukocytes, UA: NEGATIVE
Nitrite, UA: NEGATIVE
Protein, UA: NEGATIVE
Spec Grav, UA: 1.025 (ref 1.010–1.025)
Urobilinogen, UA: 0.2 E.U./dL
pH, UA: 5 (ref 5.0–8.0)

## 2022-02-12 NOTE — Patient Instructions (Signed)
Urinary Tract Infection, Adult A urinary tract infection (UTI) is an infection of any part of the urinary tract. The urinary tract includes: The kidneys. The ureters. The bladder. The urethra. These organs make, store, and get rid of pee (urine) in the body. What are the causes? This infection is caused by germs (bacteria) in your genital area. These germs grow and cause swelling (inflammation) of your urinary tract. What increases the risk? The following factors may make you more likely to develop this condition: Using a small, thin tube (catheter) to drain pee. Not being able to control when you pee or poop (incontinence). Being female. If you are female, these things can increase the risk: Using these methods to prevent pregnancy: A medicine that kills sperm (spermicide). A device that blocks sperm (diaphragm). Having low levels of a female hormone (estrogen). Being pregnant. You are more likely to develop this condition if: You have genes that add to your risk. You are sexually active. You take antibiotic medicines. You have trouble peeing because of: A prostate that is bigger than normal, if you are female. A blockage in the part of your body that drains pee from the bladder. A kidney stone. A nerve condition that affects your bladder. Not getting enough to drink. Not peeing often enough. You have other conditions, such as: Diabetes. A weak disease-fighting system (immune system). Sickle cell disease. Gout. Injury of the spine. What are the signs or symptoms? Symptoms of this condition include: Needing to pee right away. Peeing small amounts often. Pain or burning when peeing. Blood in the pee. Pee that smells bad or not like normal. Trouble peeing. Pee that is cloudy. Fluid coming from the vagina, if you are female. Pain in the belly or lower back. Other symptoms include: Vomiting. Not feeling hungry. Feeling mixed up (confused). This may be the first symptom in  older adults. Being tired and grouchy (irritable). A fever. Watery poop (diarrhea). How is this treated? Taking antibiotic medicine. Taking other medicines. Drinking enough water. In some cases, you may need to see a specialist. Follow these instructions at home:  Medicines Take over-the-counter and prescription medicines only as told by your doctor. If you were prescribed an antibiotic medicine, take it as told by your doctor. Do not stop taking it even if you start to feel better. General instructions Make sure you: Pee until your bladder is empty. Do not hold pee for a long time. Empty your bladder after sex. Wipe from front to back after peeing or pooping if you are a female. Use each tissue one time when you wipe. Drink enough fluid to keep your pee pale yellow. Keep all follow-up visits. Contact a doctor if: You do not get better after 1-2 days. Your symptoms go away and then come back. Get help right away if: You have very bad back pain. You have very bad pain in your lower belly. You have a fever. You have chills. You feeling like you will vomit or you vomit. Summary A urinary tract infection (UTI) is an infection of any part of the urinary tract. This condition is caused by germs in your genital area. There are many risk factors for a UTI. Treatment includes antibiotic medicines. Drink enough fluid to keep your pee pale yellow. This information is not intended to replace advice given to you by your health care provider. Make sure you discuss any questions you have with your health care provider. Document Revised: 09/22/2019 Document Reviewed: 09/22/2019 Elsevier Patient Education    2023 Elsevier Inc.  

## 2022-02-12 NOTE — Progress Notes (Signed)
Subjective:    Carmen Turner is a 41 y.o. female who complains of frequency, suprapubic pressure, and urgency for  1.5  week.  Patient also complains of back pain and vaginal discharge. Patient denies congestion, cough, fever, headache, rhinitis, and sorethroat.  Patient does not have a history of recurrent UTI.  Patient does not have a history of pyelonephritis. The following portions of the patient's history were reviewed and updated as appropriate: allergies, current medications, and problem list. Review of Systems Pertinent items are noted in HPI.    Objective:    BP 132/80   Pulse 67   Temp 98 F (36.7 C) (Oral)   Ht '5\' 4"'$  (1.626 m)   Wt 122 lb 9.6 oz (55.6 kg)   BMI 21.04 kg/m  General: alert and mild distress             Assessment:    vaginitis    Plan: Plan:    1. Medications: not indicated at this time 2. Maintain adequate hydration 3. Follow up if symptoms not improving, and prn.   Nunzio Cory.W, NCMA

## 2022-02-13 LAB — CERVICOVAGINAL ANCILLARY ONLY
Bacterial Vaginitis (gardnerella): NEGATIVE
Candida Glabrata: NEGATIVE
Candida Vaginitis: NEGATIVE
Chlamydia: NEGATIVE
Comment: NEGATIVE
Comment: NEGATIVE
Comment: NEGATIVE
Comment: NEGATIVE
Comment: NEGATIVE
Comment: NORMAL
Neisseria Gonorrhea: NEGATIVE
Trichomonas: NEGATIVE

## 2022-02-14 LAB — URINE CULTURE: Organism ID, Bacteria: NO GROWTH

## 2022-02-19 ENCOUNTER — Encounter: Payer: Self-pay | Admitting: Obstetrics

## 2022-06-11 ENCOUNTER — Other Ambulatory Visit: Payer: Self-pay

## 2022-06-11 DIAGNOSIS — Z1231 Encounter for screening mammogram for malignant neoplasm of breast: Secondary | ICD-10-CM

## 2022-07-06 NOTE — Progress Notes (Unsigned)
    Patient, No Pcp Per   No chief complaint on file.   HPI:      Carmen Turner is a 42 y.o. [id number] whose LMP was No LMP recorded., presents today for *** Uses scented soap and wears thongs.  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  Social History   Socioeconomic History   Marital status: Legally Separated    Spouse name: Not on file   Number of children: Not on file   Years of education: Not on file   Highest education level: Not on file  Occupational History   Not on file  Tobacco Use   Smoking status: Former    Packs/day: 1.5    Types: Cigarettes    Quit date: 05/24/2008    Years since quitting: 14.1    Passive exposure: Past   Smokeless tobacco: Never  Vaping Use   Vaping Use: Never used  Substance and Sexual Activity   Alcohol use: Yes   Drug use: No   Sexual activity: Yes    Birth control/protection: Surgical    Comment: Tubal Ligation  Other Topics Concern   Not on file  Social History Narrative   Not on file   Social Determinants of Health   Financial Resource Strain: Not on file  Food Insecurity: Not on file  Transportation Needs: Not on file  Physical Activity: Not on file  Stress: Not on file  Social Connections: Not on file  Intimate Partner Violence: Not on file    Outpatient Medications Prior to Visit   Medication Sig Dispense Refill   sertraline (ZOLOFT) 50 MG tablet Take 50 mg by mouth daily.     valACYclovir (VALTREX) 1000 MG tablet Take 1,000 mg by mouth 3 (three) times daily.     No facility-administered medications prior to visit.      ROS:  Review of Systems BREAST: No symptoms   OBJECTIVE:   Vitals:  There were no vitals taken for this visit.  Physical Exam  Results: No results found for this or any previous visit (from the past 24 hour(s)).   Assessment/Plan: No diagnosis found.    No orders of the defined types were placed in this encounter.     No follow-ups on file.  [name] B. Obrian Bulson, PA-C 07/06/2022 2:57 PM

## 2022-07-07 ENCOUNTER — Encounter: Payer: Self-pay | Admitting: Obstetrics and Gynecology

## 2022-07-07 ENCOUNTER — Ambulatory Visit (INDEPENDENT_AMBULATORY_CARE_PROVIDER_SITE_OTHER): Payer: Medicaid Other | Admitting: Obstetrics and Gynecology

## 2022-07-07 VITALS — BP 114/70 | Ht 62.0 in | Wt 116.0 lb

## 2022-07-07 DIAGNOSIS — R399 Unspecified symptoms and signs involving the genitourinary system: Secondary | ICD-10-CM | POA: Diagnosis not present

## 2022-07-07 DIAGNOSIS — N898 Other specified noninflammatory disorders of vagina: Secondary | ICD-10-CM | POA: Diagnosis not present

## 2022-07-07 LAB — POCT URINALYSIS DIPSTICK
Bilirubin, UA: NEGATIVE
Glucose, UA: NEGATIVE
Ketones, UA: NEGATIVE
Leukocytes, UA: NEGATIVE
Nitrite, UA: NEGATIVE
Protein, UA: NEGATIVE
Spec Grav, UA: 1.015 (ref 1.010–1.025)
pH, UA: 6 (ref 5.0–8.0)

## 2022-07-07 LAB — POCT WET PREP WITH KOH
Clue Cells Wet Prep HPF POC: NEGATIVE
KOH Prep POC: NEGATIVE
Trichomonas, UA: NEGATIVE
Yeast Wet Prep HPF POC: NEGATIVE

## 2022-07-07 NOTE — Patient Instructions (Signed)
I value your feedback and you entrusting us with your care. If you get a [facility] patient survey, I would appreciate you taking the time to let us know about your experience today. Thank you! ? ? ?

## 2022-07-09 LAB — URINE CULTURE

## 2022-07-10 LAB — NUSWAB VAGINITIS (VG)
Candida albicans, NAA: NEGATIVE
Candida glabrata, NAA: NEGATIVE
Trich vag by NAA: NEGATIVE

## 2022-07-14 LAB — MYCOPLASMA / UREAPLASMA CULTURE
Mycoplasma hominis Culture: NEGATIVE
Ureaplasma urealyticum: NEGATIVE

## 2022-10-04 NOTE — Progress Notes (Unsigned)
    Patient, No Pcp Per   No chief complaint on file.   HPI:      [name] is a 42 y.o. 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  Social History   Socioeconomic History   Marital status: Legally Separated    Spouse name: Not on file   Number of children: Not on file   Years of education: Not on file   Highest education level: Not on file  Occupational History   Not on file  Tobacco Use   Smoking status: Former    Current packs/day: 0.00    Types: Cigarettes    Quit date: 05/24/2008    Years since quitting: 14.3    Passive exposure: Past   Smokeless tobacco: Never  Vaping Use   Vaping status: Never Used  Substance and Sexual Activity   Alcohol use: Yes   Drug use: No   Sexual activity: Not Currently    Birth control/protection: Surgical    Comment: Tubal Ligation  Other Topics Concern   Not on file  Social History Narrative   Not on file   Social Determinants of Health   Financial Resource Strain: Not on file  Food Insecurity: Not on file  Transportation Needs: Not on file  Physical Activity: Not on file  Stress: Not on file  Social Connections: Not on file  Intimate Partner Violence: Not on file    Outpatient Medications Prior to Visit  Medication Sig Dispense Refill   traZODone (DESYREL) 50 MG tablet Take 50 mg by mouth at bedtime.     venlafaxine XR (EFFEXOR-XR) 37.5 MG 24 hr  capsule Take by mouth.     No facility-administered medications prior to visit.      ROS:  Review of Systems BREAST: No symptoms   OBJECTIVE:   Vitals:  There were no vitals taken for this visit.  Physical Exam  Results: No results found for this or any previous visit (from the past 24 hour(s)).   Assessment/Plan: No diagnosis found.    No orders of the defined types were placed in this encounter.     No follow-ups on file.  Channon Ambrosini B. Shuna Tabor, PA-C 10/04/2022 9:25 PM

## 2022-10-05 ENCOUNTER — Encounter: Payer: Self-pay | Admitting: Obstetrics and Gynecology

## 2022-10-05 ENCOUNTER — Ambulatory Visit (INDEPENDENT_AMBULATORY_CARE_PROVIDER_SITE_OTHER): Payer: Medicaid Other | Admitting: Obstetrics and Gynecology

## 2022-10-05 VITALS — BP 114/70 | Ht 62.0 in | Wt 117.0 lb

## 2022-10-05 DIAGNOSIS — Z113 Encounter for screening for infections with a predominantly sexual mode of transmission: Secondary | ICD-10-CM

## 2022-10-05 DIAGNOSIS — N939 Abnormal uterine and vaginal bleeding, unspecified: Secondary | ICD-10-CM | POA: Diagnosis not present

## 2022-10-05 DIAGNOSIS — R399 Unspecified symptoms and signs involving the genitourinary system: Secondary | ICD-10-CM | POA: Diagnosis not present

## 2022-10-05 DIAGNOSIS — N898 Other specified noninflammatory disorders of vagina: Secondary | ICD-10-CM | POA: Diagnosis not present

## 2022-10-05 LAB — POCT URINALYSIS DIPSTICK
Bilirubin, UA: NEGATIVE
Blood, UA: NEGATIVE
Glucose, UA: NEGATIVE
Ketones, UA: NEGATIVE
Leukocytes, UA: NEGATIVE
Nitrite, UA: NEGATIVE
Protein, UA: NEGATIVE
Spec Grav, UA: 1.015 (ref 1.010–1.025)
pH, UA: 7.5 (ref 5.0–8.0)

## 2022-10-05 LAB — POCT WET PREP WITH KOH
Clue Cells Wet Prep HPF POC: NEGATIVE
KOH Prep POC: NEGATIVE
Trichomonas, UA: NEGATIVE
Yeast Wet Prep HPF POC: NEGATIVE

## 2022-10-05 NOTE — Patient Instructions (Signed)
I value your feedback and you entrusting us with your care. If you get a [facility] patient survey, I would appreciate you taking the time to let us know about your experience today. Thank you! ? ? ?

## 2022-10-09 ENCOUNTER — Encounter: Payer: Self-pay | Admitting: Obstetrics and Gynecology

## 2022-10-12 ENCOUNTER — Other Ambulatory Visit: Payer: Self-pay | Admitting: Obstetrics and Gynecology

## 2022-10-12 DIAGNOSIS — N921 Excessive and frequent menstruation with irregular cycle: Secondary | ICD-10-CM

## 2022-10-12 NOTE — Progress Notes (Signed)
Pt with heavy bleeding now, due for  menses. LMP 09/14/22. Seen last wk for BTB, UTI sx and vag sx. Neg testing. Pt wants Gyn u/s, order placed. Will f/u with results. S/p TL.

## 2022-10-14 ENCOUNTER — Ambulatory Visit (INDEPENDENT_AMBULATORY_CARE_PROVIDER_SITE_OTHER): Payer: Medicaid Other

## 2022-10-14 ENCOUNTER — Other Ambulatory Visit: Payer: Self-pay | Admitting: Obstetrics and Gynecology

## 2022-10-14 ENCOUNTER — Encounter: Payer: Self-pay | Admitting: Obstetrics and Gynecology

## 2022-10-14 DIAGNOSIS — N921 Excessive and frequent menstruation with irregular cycle: Secondary | ICD-10-CM

## 2022-11-03 NOTE — Telephone Encounter (Signed)
Pt called back and said appt with [facility] is regarding irregular bleeding between her cycles. Transferred to FD to r/s with an MD per [facility].

## 2022-11-03 NOTE — Telephone Encounter (Signed)
Pt has an appt with [facility] on Monday 11/09/22. Per ABC called her to see what are we following up on, if its related to the irregular bleeding, she needs to reschedule appointment with an MD. Called pt, no answer, LVMTRC.

## 2022-11-03 NOTE — Telephone Encounter (Signed)
I contacted the patient via phone x2. No answer. I left voicemail advising the patient of rescheduled to MD per Helmut Muster for irregular bleeding. I asked the patient to call back to confirm Monday, 9/16 at 3:35 pm with Dr. Marice Potter.

## 2022-11-09 ENCOUNTER — Ambulatory Visit: Payer: Medicaid Other | Admitting: Obstetrics & Gynecology

## 2022-11-09 ENCOUNTER — Ambulatory Visit: Payer: Medicaid Other | Admitting: Obstetrics and Gynecology

## 2023-01-01 ENCOUNTER — Encounter: Payer: Self-pay | Admitting: Certified Nurse Midwife

## 2023-01-01 ENCOUNTER — Ambulatory Visit (INDEPENDENT_AMBULATORY_CARE_PROVIDER_SITE_OTHER): Payer: Medicaid Other | Admitting: Certified Nurse Midwife

## 2023-01-01 ENCOUNTER — Other Ambulatory Visit (HOSPITAL_COMMUNITY)
Admission: RE | Admit: 2023-01-01 | Discharge: 2023-01-01 | Disposition: A | Discharge status: Home / Self Care | Payer: Medicaid Other | Source: Ambulatory Visit | Attending: Certified Nurse Midwife | Admitting: Certified Nurse Midwife

## 2023-01-01 VITALS — BP 158/104 | HR 63 | Ht 62.0 in | Wt 112.6 lb

## 2023-01-01 DIAGNOSIS — B9689 Other specified bacterial agents as the cause of diseases classified elsewhere: Secondary | ICD-10-CM

## 2023-01-01 DIAGNOSIS — N898 Other specified noninflammatory disorders of vagina: Secondary | ICD-10-CM | POA: Insufficient documentation

## 2023-01-01 DIAGNOSIS — N760 Acute vaginitis: Secondary | ICD-10-CM

## 2023-01-01 DIAGNOSIS — R350 Frequency of micturition: Secondary | ICD-10-CM

## 2023-01-01 DIAGNOSIS — R030 Elevated blood-pressure reading, without diagnosis of hypertension: Secondary | ICD-10-CM | POA: Diagnosis not present

## 2023-01-01 DIAGNOSIS — I10 Essential (primary) hypertension: Secondary | ICD-10-CM

## 2023-01-01 MED ORDER — FLUCONAZOLE 150 MG PO TABS: 150.0000 mg | ORAL_TABLET | ORAL | 0 refills | Status: AC

## 2023-01-01 MED ORDER — METRONIDAZOLE 500 MG PO TABS: 500.0000 mg | ORAL_TABLET | Freq: Two times a day (BID) | ORAL | 0 refills | Status: AC

## 2023-01-01 NOTE — Progress Notes (Signed)
Patient, No Pcp Per   Chief Complaint  Patient presents with   Vaginal Discharge    HPI:      Carmen Turner is a 42 y.o. [id number] whose LMP was No LMP recorded., presents today for vaginal odor, discharge-green at times, thick white at others & urinary frequency. Endorses new sexual contact without condom use. BTL for contraception.    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 No pertinent family history.  Social History   Socioeconomic History   Marital status: Legally Separated    Spouse name: Not on file   Number of children: Not on file   Years of education: Not on file   Highest education level: Not on file  Occupational History   Not on file  Tobacco Use   Smoking status: Former    Current packs/day: 0.00    Types: Cigarettes    Quit date: 05/24/2008    Years since quitting: 14.6    Passive exposure: Past   Smokeless tobacco: Never  Vaping Use   Vaping status: Never Used  Substance and Sexual Activity   Alcohol use: Yes   Drug use: No   Sexual activity: Yes    Birth control/protection: Surgical    Comment: Tubal Ligation  Other Topics Concern   Not on file  Social History Narrative   Not on file   Social Determinants of Health   Financial Resource Strain: Not on file  Food Insecurity: Not on file  Transportation Needs: Not on file  Physical Activity: Not on file  Stress: Not on file  Social Connections: Not on file  Intimate Partner Violence: Not on file     Outpatient Medications Prior to Visit  Medication Sig Dispense Refill   traZODone (DESYREL) 50 MG tablet Take 50 mg by mouth at bedtime.     No facility-administered medications prior to visit.      ROS:  Review of Systems  Constitutional: Negative.   Respiratory: Negative.    Cardiovascular: Negative.   Genitourinary:  Positive for frequency and vaginal discharge.     OBJECTIVE:   Vitals:  BP (!) 148/96   Pulse 63   Ht 5\' 2"  (1.575 m)   Wt 112 lb 9.6 oz (51.1 kg)   BMI 20.59 kg/m  .vs Physical Exam Vitals reviewed.  Constitutional:      Appearance: Normal appearance.  Cardiovascular:     Rate and Rhythm: Normal rate.  Pulmonary:     Effort: Pulmonary effort is normal.  Genitourinary:    General: Normal vulva.     Vagina: Vaginal discharge present. No lesions.     Cervix: No friability, erythema or cervical bleeding.     Comments: NEFG, hair removed. Thin white discharge, small amount present in vaginal vault. Wet prep collected, +clue cells +amine test. Skin:    General: Skin is warm and dry.  Neurological:     General: No focal deficit present.     Mental Status: She is alert and oriented  to person, place, and time.  Psychiatric:        Mood and Affect: Mood normal.        Behavior: Behavior normal.     Results: No results found for this or any previous visit (from the past 24 hour(s)).   Assessment/Plan: BV (bacterial vaginosis) -wet prep +for BV, start metronidazole. Diflucan provided for history of yeast after BV treatment. -STI screening sent.  Vaginal discharge - Plan: Cervicovaginal ancillary only  Vaginal odor - Plan: Cervicovaginal ancillary only  Urinary frequency - Plan: Urine Culture  Elevated blood pressure reading in office with diagnosis of hypertension -return for BP check in one week -warning signs and when to seek urgent/emergent care reviewed.   Meds ordered this encounter  Medications   metroNIDAZOLE (FLAGYL) 500 MG  tablet    Sig: Take 1 tablet (500 mg total) by mouth 2 (two) times daily for 7 days.    Dispense:  14 tablet    Refill:  0    Order Specific Question:   Supervising Provider    Answer:   Hildred Laser [[id number]]   fluconazole (DIFLUCAN) 150 MG tablet    Sig: Take 1 tablet (150 mg total) by mouth every 3 (three) days for 2 doses.    Dispense:  2 tablet    Refill:  0    Order Specific Question:   Supervising Provider    Answer:   Hildred Laser [[id number]]     Dominica Severin, CNM 01/01/2023 10:09 AM

## 2023-01-03 LAB — URINE CULTURE

## 2023-01-05 LAB — CERVICOVAGINAL ANCILLARY ONLY
Bacterial Vaginitis (gardnerella): POSITIVE — AB
Candida Glabrata: NEGATIVE
Candida Vaginitis: NEGATIVE
Chlamydia: NEGATIVE
Comment: NEGATIVE
Comment: NEGATIVE
Comment: NEGATIVE
Comment: NEGATIVE
Comment: NEGATIVE
Comment: NORMAL
Neisseria Gonorrhea: NEGATIVE
Trichomonas: NEGATIVE

## 2023-01-08 ENCOUNTER — Ambulatory Visit: Payer: Medicaid Other | Admitting: Certified Nurse Midwife

## 2023-01-08 DIAGNOSIS — Z0130 Encounter for examination of blood pressure without abnormal findings: Secondary | ICD-10-CM

## 2023-01-08 NOTE — Telephone Encounter (Signed)
Tija seen by PCP this week, BP normotensive at 127/74. Ok to cancel BP check today in our clinic.

## 2023-03-02 ENCOUNTER — Ambulatory Visit: Payer: Medicaid Other | Admitting: Licensed Practical Nurse

## 2023-03-02 ENCOUNTER — Other Ambulatory Visit (HOSPITAL_COMMUNITY)
Admission: RE | Admit: 2023-03-02 | Discharge: 2023-03-02 | Disposition: A | Discharge status: Home / Self Care | Payer: Medicaid Other | Source: Ambulatory Visit | Attending: Licensed Practical Nurse | Admitting: Licensed Practical Nurse

## 2023-03-02 VITALS — BP 144/85 | HR 64 | Wt 114.6 lb

## 2023-03-02 DIAGNOSIS — N760 Acute vaginitis: Secondary | ICD-10-CM

## 2023-03-02 DIAGNOSIS — B9689 Other specified bacterial agents as the cause of diseases classified elsewhere: Secondary | ICD-10-CM | POA: Diagnosis not present

## 2023-03-02 DIAGNOSIS — R399 Unspecified symptoms and signs involving the genitourinary system: Secondary | ICD-10-CM | POA: Diagnosis not present

## 2023-03-02 DIAGNOSIS — N898 Other specified noninflammatory disorders of vagina: Secondary | ICD-10-CM | POA: Insufficient documentation

## 2023-03-02 LAB — POCT URINALYSIS DIPSTICK
Bilirubin, UA: NEGATIVE
Blood, UA: NEGATIVE
Glucose, UA: NEGATIVE
Ketones, UA: NEGATIVE
Leukocytes, UA: NEGATIVE
Nitrite, UA: NEGATIVE
Protein, UA: NEGATIVE
Spec Grav, UA: 1.015 (ref 1.010–1.025)
Urobilinogen, UA: 0.2 U/dL
pH, UA: 6.5 (ref 5.0–8.0)

## 2023-03-02 MED ORDER — METRONIDAZOLE 0.75 % VA GEL: 1.0000 | Freq: Every day | VAGINAL | 1 refills | Status: DC

## 2023-03-02 NOTE — Progress Notes (Signed)
  HPI:      [name] is a 43 y.o. [id number] who LMP was No LMP recorded., presents today for a problem visit.  She complains of:  Vaginitis: Patient complains of an abnormal vaginal discharge for 3 days. She has  noticed an odor. She is sexually active, has had 1 female partner in the last 3 months. Uses  BTL for contraception, does not use condoms. Denies intermenstrual bleeding or spotting after IC. Cycles are monthly. LMP 2.5wks ago. She was treated for BV in November only completed about 3-4 days worth d/t the medication causing and upset stomach.   UTI symptoms: increased urgency, frequency and some dysuria x 3 days, denies fevers or low back pain   PMHx: She  has a past medical history of Anemia and Hypertension. Also,  has a past surgical history that includes Cholecystectomy; Cesarean section; and Cesarean section with bilateral tubal ligation (N/A, 11/07/2015)., family history is not on file.,  reports that she quit smoking about 14 years ago. Her smoking use included cigarettes. She has been exposed to tobacco smoke. She has never used smokeless tobacco. She reports current alcohol use. She reports that she does not use drugs.  She has a current medication list which includes the following prescription(s): metronidazole  and trazodone. Also, is allergic to ibuprofen .  ROS see HPI  Objective: BP (!) 144/85 (BP Location: Left Arm, Patient Position: Sitting, Cuff Size: Normal)   Pulse 64   Wt 114 lb 9.6 oz (52 kg)   BMI 20.96 kg/m  Physical Exam Constitutional:      Appearance: Normal appearance.  Genitourinary:     Vulva normal.     Genitourinary Comments: Vuvla slightly irritated looking SSE cervix pink, no lesions, thin white-yellow discharge present, odor present  Wet prep: negative PH, change hyphea or trich                 Pos whiff and clue cells   Cardiovascular:     Rate and Rhythm: Normal rate.  Abdominal:     General: Abdomen is flat.     Tenderness: There is  no abdominal tenderness.  Musculoskeletal:     Cervical back: Normal range of motion.  Neurological:     Mental Status: She is alert.  Skin:    General: Skin is warm.  Psychiatric:        Mood and Affect: Mood normal.    ASSESSMENT/PLAN:    Problem List Items Addressed This Visit   None Visit Diagnoses       Vaginal odor    -  Primary   Relevant Orders   Cervicovaginal ancillary only     UTI symptoms       Relevant Orders   POCT Urinalysis Dipstick (Completed)   Urine Culture     BV (bacterial vaginosis)       Relevant Medications   metroNIDAZOLE  (METROGEL ) 0.75 % vaginal gel      Jinnie Cookey, CNM  [facility]  03/02/23  2:27 PM

## 2023-03-04 LAB — CERVICOVAGINAL ANCILLARY ONLY
Bacterial Vaginitis (gardnerella): POSITIVE — AB
Candida Glabrata: NEGATIVE
Candida Vaginitis: NEGATIVE
Chlamydia: NEGATIVE
Comment: NEGATIVE
Comment: NEGATIVE
Comment: NEGATIVE
Comment: NEGATIVE
Comment: NEGATIVE
Comment: NORMAL
Neisseria Gonorrhea: NEGATIVE
Trichomonas: NEGATIVE

## 2023-03-04 LAB — URINE CULTURE

## 2023-06-03 ENCOUNTER — Encounter: Payer: Self-pay | Admitting: Family Medicine

## 2023-06-03 ENCOUNTER — Other Ambulatory Visit: Payer: Self-pay | Admitting: Family Medicine

## 2023-06-03 DIAGNOSIS — M5412 Radiculopathy, cervical region: Secondary | ICD-10-CM

## 2023-06-06 ENCOUNTER — Other Ambulatory Visit

## 2023-06-16 ENCOUNTER — Inpatient Hospital Stay: Admission: RE | Admit: 2023-06-16 | Source: Ambulatory Visit

## 2023-06-22 ENCOUNTER — Ambulatory Visit: Admitting: Obstetrics and Gynecology

## 2023-06-23 NOTE — Progress Notes (Signed)
    GYNECOLOGY PROGRESS NOTE  Subjective:  PCP: Cherisse Cornell  Patient ID: Carmen Turner, female    DOB: [birth year], 43 y.o.   MRN: [id number]  HPI  Patient is a 43 y.o. [id number] female who presents as referral from [facility] [place] [place] for an endometrial polyp. Pt last saw Evalyn Hillier 10/05/22, ordered an ultrasound for AUB/menorrhagia and it showed a possible endometrial polyp. Patient scheduled to see Dr. Everardo Hitch 11/09/22 to discuss further management of her bleeding, was lost to follow up. Today she states her periods are regular Q30d, but the first 1-3 days are extremely heavy, wearing pads and tampons and leaking through her clothing and cannot make it through the night without leaking. This is disruptive to her ADLs. She also has daily pelvic pain and urinary frequency, has seen urology about 1 yr ago and was told to do pelvic floor therapy, but she is not interested in completing, or seeing them again. Today expresses her desire for a hysterectomy, is certain she is done with childbearing. She does not want medical or other procedural options to address her bleeding.   The following portions of the patient's history were reviewed and updated as appropriate: allergies, current medications, past family history, past medical history, past social history, past surgical history, and problem list.  Review of Systems Pertinent items are noted in HPI.   Objective:   Blood pressure 138/72, pulse 61, height 5\' 3"  (1.6 m), weight 118 lb (53.5 kg), last menstrual period 06/10/2023. Body mass index is 20.9 kg/m.  General appearance: alert and cooperative Abdomen: soft, non-tender; bowel sounds normal; no masses,  no organomegaly Pelvic: deferred Extremities: extremities normal, atraumatic, no cyanosis or edema Neurologic: Grossly normal   10/14/22 US  PELVIC Findings:  The uterus is retroverted and measures 7.76 x 5.71 x 4.85 cm. Echo texture is homogenous without evidence of focal masses.   The  Endometrium measures 7.01 mm. Possible polyp with vascular flow and stalk seen measuring; 1.14mm   Right Ovary measures 3.36 x 2.94 x 1.98 cm. It is normal in appearance. Left Ovary measures 2.08 x 1.04 x 1.08 cm. It is normal in appearance. Survey of the adnexa demonstrates no adnexal masses. There is no free fluid in the cul de sac.   Impression: 1. Possible polyp seen within endometrium   Assessment/Plan:   1. Abnormal uterine bleeding (AUB)   2. Menorrhagia with regular cycle   3. Pelvic pain   4. Endometrial polyp   5. Dysuria   6. Urine frequency   7. Retroflexion of uterus     43 y.o. [id number] with chronic pelvic pain and AUB/HMB with possible endometrial polyp on US  from 09/2022, desiring hysterectomy and declining alternative treatment options. Also with urinary frequency, UA in office normal.  -Will schedule her to have a consultation with Dr. Luster Salters -Encouraged her to re-establish with urology. -Offered temporary medication for HMB while awaiting consultation and declines.    [name], DO [facility]

## 2023-06-24 ENCOUNTER — Encounter: Payer: Self-pay | Admitting: Obstetrics

## 2023-06-24 ENCOUNTER — Ambulatory Visit (INDEPENDENT_AMBULATORY_CARE_PROVIDER_SITE_OTHER): Admitting: Obstetrics

## 2023-06-24 VITALS — BP 138/72 | HR 61 | Ht 63.0 in | Wt 118.0 lb

## 2023-06-24 DIAGNOSIS — R102 Pelvic and perineal pain unspecified side: Secondary | ICD-10-CM | POA: Insufficient documentation

## 2023-06-24 DIAGNOSIS — N920 Excessive and frequent menstruation with regular cycle: Secondary | ICD-10-CM | POA: Insufficient documentation

## 2023-06-24 DIAGNOSIS — N840 Polyp of corpus uteri: Secondary | ICD-10-CM | POA: Diagnosis not present

## 2023-06-24 DIAGNOSIS — N939 Abnormal uterine and vaginal bleeding, unspecified: Secondary | ICD-10-CM | POA: Insufficient documentation

## 2023-06-24 DIAGNOSIS — R300 Dysuria: Secondary | ICD-10-CM

## 2023-06-24 DIAGNOSIS — N854 Malposition of uterus: Secondary | ICD-10-CM | POA: Insufficient documentation

## 2023-06-24 DIAGNOSIS — G8929 Other chronic pain: Secondary | ICD-10-CM | POA: Diagnosis not present

## 2023-06-24 DIAGNOSIS — R350 Frequency of micturition: Secondary | ICD-10-CM

## 2023-06-24 LAB — POCT URINALYSIS DIPSTICK
Bilirubin, UA: NEGATIVE
Blood, UA: NEGATIVE
Glucose, UA: NEGATIVE
Ketones, UA: NEGATIVE
Leukocytes, UA: NEGATIVE
Nitrite, UA: NEGATIVE
Protein, UA: NEGATIVE
Spec Grav, UA: 1.015 (ref 1.010–1.025)
Urobilinogen, UA: 1 U/dL
pH, UA: 6.5 (ref 5.0–8.0)

## 2023-07-22 ENCOUNTER — Ambulatory Visit: Admitting: Obstetrics and Gynecology

## 2023-08-17 ENCOUNTER — Encounter: Payer: Self-pay | Admitting: Obstetrics and Gynecology

## 2023-08-17 ENCOUNTER — Ambulatory Visit (INDEPENDENT_AMBULATORY_CARE_PROVIDER_SITE_OTHER): Admitting: Obstetrics and Gynecology

## 2023-08-17 ENCOUNTER — Other Ambulatory Visit (HOSPITAL_COMMUNITY)
Admission: RE | Admit: 2023-08-17 | Discharge: 2023-08-17 | Disposition: A | Discharge status: Home / Self Care | Source: Ambulatory Visit | Attending: Obstetrics and Gynecology | Admitting: Obstetrics and Gynecology

## 2023-08-17 VITALS — BP 128/86 | HR 64 | Ht 63.0 in | Wt 122.3 lb

## 2023-08-17 DIAGNOSIS — N920 Excessive and frequent menstruation with regular cycle: Secondary | ICD-10-CM

## 2023-08-17 DIAGNOSIS — N840 Polyp of corpus uteri: Secondary | ICD-10-CM | POA: Diagnosis not present

## 2023-08-17 DIAGNOSIS — N939 Abnormal uterine and vaginal bleeding, unspecified: Secondary | ICD-10-CM

## 2023-08-17 DIAGNOSIS — N8189 Other female genital prolapse: Secondary | ICD-10-CM | POA: Diagnosis not present

## 2023-08-17 DIAGNOSIS — N930 Postcoital and contact bleeding: Secondary | ICD-10-CM

## 2023-08-17 DIAGNOSIS — Z124 Encounter for screening for malignant neoplasm of cervix: Secondary | ICD-10-CM | POA: Insufficient documentation

## 2023-08-17 DIAGNOSIS — R102 Pelvic and perineal pain: Secondary | ICD-10-CM

## 2023-08-17 NOTE — Progress Notes (Signed)
 Patient presents today to discuss surgical options due to heavy menstrual cycles and a polyp. She reports heavy cycles and bleeding after intercourse for an estimated 2 years and would now like a hysterectomy. Pap smear preformed today.

## 2023-08-17 NOTE — Progress Notes (Addendum)
 HPI:      [name] is a 43 y.o. [id number] who LMP was Patient's last menstrual period was 07/29/2023.  Subjective:   She presents today with complaint of postcoital bleeding over the last 2 years.  In addition she states that she has regular urine leakage with coughing laughing and sneezing and she is unhappy with this.  She says that she was told in the past that she had some prolapse.  In addition she underwent an ultrasound last year which showed a possible endometrial polyp. She does not have intermenstrual bleeding and is now having normal regular menses.  Her menses last 5 days. She has a tubal ligation for birth control. She is requesting hysterectomy with prolapse repair for her urine loss and understandably unhappy with postcoital bleeding.    Hx: The following portions of the patient's history were reviewed and updated as appropriate:             She  has a past medical history of Anemia and Hypertension. She does not have any pertinent problems on file. She  has a past surgical history that includes Cholecystectomy; Cesarean section; and Cesarean section with bilateral tubal ligation (N/A, 11/07/2015). Her family history is not on file. She  reports that she quit smoking about 15 years ago. Her smoking use included cigarettes. She has been exposed to tobacco smoke. She has never used smokeless tobacco. She reports current alcohol use. She reports that she does not use drugs. She has a current medication list which includes the following prescription(s): metronidazole  and trazodone. She is allergic to ibuprofen .       Review of Systems:  Review of Systems  Constitutional: Denied constitutional symptoms, night sweats, recent illness, fatigue, fever, insomnia and weight loss.  Eyes: Denied eye symptoms, eye pain, photophobia, vision change and visual disturbance.  Ears/Nose/Throat/Neck: Denied ear, nose, throat or neck symptoms, hearing loss, nasal discharge, sinus  congestion and sore throat.  Cardiovascular: Denied cardiovascular symptoms, arrhythmia, chest pain/pressure, edema, exercise intolerance, orthopnea and palpitations.  Respiratory: Denied pulmonary symptoms, asthma, pleuritic pain, productive sputum, cough, dyspnea and wheezing.  Gastrointestinal: Denied, gastro-esophageal reflux, melena, nausea and vomiting.  Genitourinary: See HPI for additional information.  Musculoskeletal: Denied musculoskeletal symptoms, stiffness, swelling, muscle weakness and myalgia.  Dermatologic: Denied dermatology symptoms, rash and scar.  Neurologic: Denied neurology symptoms, dizziness, headache, neck pain and syncope.  Psychiatric: Denied psychiatric symptoms, anxiety and depression.  Endocrine: Denied endocrine symptoms including hot flashes and night sweats.   Meds:   Current Outpatient Medications on File Prior to Visit  Medication Sig Dispense Refill   metroNIDAZOLE  (METROGEL ) 0.75 % vaginal gel Place 1 Applicatorful vaginally at bedtime. Apply one applicatorful to vagina at bedtime for 5 days (Patient not taking: Reported on 06/24/2023) 70 g 1   traZODone (DESYREL) 50 MG tablet Take 50 mg by mouth at bedtime. (Patient not taking: Reported on 06/24/2023)     No current facility-administered medications on file prior to visit.      Objective:     Vitals:   08/17/23 1427  BP: 128/86  Pulse: 64   Filed Weights   08/17/23 1427  Weight: 122 lb 4.8 oz (55.5 kg)              Physical examination   Pelvic:   Vulva: Normal appearance.  No lesions.  Vagina: No lesions or abnormalities noted.  Support: Second-degree uterine descensus -second-degree cystocele -third-degree rectocele  Urethra No masses tenderness or scarring.  Meatus  Normal size without lesions or prolapse.  Cervix: Normal appearance.  No lesions.  Anus: Normal exam.  No lesions.  Perineum: Normal exam.  No lesions.        Bimanual   Uterus: Normal size.  Non-tender.  Mobile.  AV.   Adnexae: No masses.  Non-tender to palpation.  Cul-de-sac: Negative for abnormality.             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 Endometrial polyp   2. Cervical cancer screening   3. Pelvic relaxation disorder   4. Postcoital bleeding     I find no cause of postcoital bleeding on her exam today.    Plan:            1.  Patient desires definitive surgery-hysterectomy.  She mainly desires this for urine loss and prolapse regions although she continues to have postcoital bleeding with almost every active intercourse.  2.  To schedule preop appointment for LAVH AMP repair TOT.  3.  Pap performed  Orders No orders of the defined types were placed in this encounter.   No orders of the defined types were placed in this encounter.     F/U  Return in about 2 weeks (around 08/31/2023).  Alm DOROTHA Sar, M.D. 08/17/2023 3:47 PM

## 2023-08-20 ENCOUNTER — Encounter: Payer: Self-pay | Admitting: Obstetrics and Gynecology

## 2023-08-20 ENCOUNTER — Ambulatory Visit: Payer: Self-pay

## 2023-08-20 LAB — CYTOLOGY - PAP
Comment: NEGATIVE
Diagnosis: UNDETERMINED — AB
High risk HPV: POSITIVE — AB

## 2023-09-02 ENCOUNTER — Ambulatory Visit: Admitting: Obstetrics and Gynecology

## 2023-09-02 ENCOUNTER — Encounter: Payer: Self-pay | Admitting: Obstetrics and Gynecology

## 2023-09-02 VITALS — BP 142/91 | HR 67 | Ht 63.0 in | Wt 121.2 lb

## 2023-09-02 DIAGNOSIS — N8189 Other female genital prolapse: Secondary | ICD-10-CM

## 2023-09-02 DIAGNOSIS — N840 Polyp of corpus uteri: Secondary | ICD-10-CM

## 2023-09-02 DIAGNOSIS — Z01818 Encounter for other preprocedural examination: Secondary | ICD-10-CM

## 2023-09-02 DIAGNOSIS — N930 Postcoital and contact bleeding: Secondary | ICD-10-CM

## 2023-09-02 MED ORDER — METRONIDAZOLE 500 MG PO TABS: 500.0000 mg | ORAL_TABLET | Freq: Two times a day (BID) | ORAL | 0 refills | Status: DC

## 2023-09-02 NOTE — Progress Notes (Signed)
 Patient presents today for a pre-op exam. She states no additional concerns today.

## 2023-09-02 NOTE — H&P (Signed)
 PRE-OPERATIVE HISTORY AND PHYSICAL EXAM  PCP:  [facility] Subjective:   HPI:  Carmen Turner is a 43 y.o. [id number].  Patient's last menstrual period was 08/17/2023.  She presents today for a pre-op discussion and PE.  She has the following symptoms: Having pelvic relaxation symptoms and stress urinary incontinence.  She also has experienced problems with postcoital bleeding.  Review of Systems:   Constitutional: Denied constitutional symptoms, night sweats, recent illness, fatigue, fever, insomnia and weight loss.  Eyes: Denied eye symptoms, eye pain, photophobia, vision change and visual disturbance.  Ears/Nose/Throat/Neck: Denied ear, nose, throat or neck symptoms, hearing loss, nasal discharge, sinus congestion and sore throat.  Cardiovascular: Denied cardiovascular symptoms, arrhythmia, chest pain/pressure, edema, exercise intolerance, orthopnea and palpitations.  Respiratory: Denied pulmonary symptoms, asthma, pleuritic pain, productive sputum, cough, dyspnea and wheezing.  Gastrointestinal: Denied, gastro-esophageal reflux, melena, nausea and vomiting.  Genitourinary: See HPI for additional information.  Musculoskeletal: Denied musculoskeletal symptoms, stiffness, swelling, muscle weakness and myalgia.  Dermatologic: Denied dermatology symptoms, rash and scar.  Neurologic: Denied neurology symptoms, dizziness, headache, neck pain and syncope.  Psychiatric: Denied psychiatric symptoms, anxiety and depression.  Endocrine: Denied endocrine symptoms including hot flashes and night sweats.   OB History  Gravida Para Term Preterm AB Living  6 5 5  1 5   SAB IAB Ectopic Multiple Live Births   1   5    # Outcome Date GA Lbr Len/2nd Weight Sex Type Anes PTL Lv  6 IAB           5 Term           4 Term           3 Term           2 Term           1 Term             Past Medical History:  Diagnosis Date   Anemia    Hypertension     Past Surgical History:   Procedure Laterality Date   CESAREAN SECTION     CESAREAN SECTION WITH BILATERAL TUBAL LIGATION N/A 11/07/2015   Procedure: CESAREAN SECTION WITH BILATERAL TUBAL LIGATION;  Surgeon: Lamar SHAUNNA Lesches, MD;  Location: [facility] ORS;  Service: Obstetrics;  Laterality: N/A;   CHOLECYSTECTOMY        SOCIAL HISTORY:  Social History   Tobacco Use  Smoking Status Former   Current packs/day: 0.00   Types: Cigarettes   Quit date: 05/24/2008   Years since quitting: 15.2   Passive exposure: Past  Smokeless Tobacco Never   Social History   Substance and Sexual Activity  Alcohol Use Yes    Social History   Substance and Sexual Activity  Drug Use No    History reviewed. No pertinent family history.  ALLERGIES:  Ibuprofen   MEDS:   Current Outpatient Medications on File Prior to Visit  Medication Sig Dispense Refill   metroNIDAZOLE  (METROGEL ) 0.75 % vaginal gel Place 1 Applicatorful vaginally at bedtime. Apply one applicatorful to vagina at bedtime for 5 days (Patient not taking: Reported on 06/24/2023) 70 g 1   traZODone (DESYREL) 50 MG tablet Take 50 mg by mouth at bedtime. (Patient not taking: Reported on 06/24/2023)     No current facility-administered medications on file prior to visit.    Meds ordered this encounter  Medications   metroNIDAZOLE  (FLAGYL ) 500 MG tablet    Sig: Take 1 tablet (500  mg total) by mouth 2 (two) times daily. Begin 5 days prior to scheduled surgery as directed.    Dispense:  10 tablet    Refill:  0     Physical examination BP (!) 142/91   Pulse 67   Ht 5' 3 (1.6 m)   Wt 121 lb 3.2 oz (55 kg)   LMP 08/17/2023   BMI 21.47 kg/m   General NAD, Conversant  HEENT Atraumatic; Op clear with mmm.  Normo-cephalic.  Anicteric sclerae  Thyroid/Neck Smooth without nodularity or enlargement. Normal ROM.  Neck Supple.  Skin No rashes, lesions or ulceration. Normal palpated skin turgor. No nodularity.  Breasts: No masses or discharge.  Symmetric.  No axillary  adenopathy.  Lungs: Clear to auscultation.No rales or wheezes. Normal Respiratory effort, no retractions.  Heart: NSR.  No murmurs or rubs appreciated. No peripheral edema  Abdomen: Soft.  Non-tender.  No masses.  No HSM. No hernia  Extremities: Moves all appropriately.  Normal ROM for age. No lymphadenopathy.  Neuro: Oriented to PPT.  Normal mood. Normal affect.     Pelvic:   Vulva: Normal appearance.  No lesions.  Vagina: No lesions or abnormalities noted.  Support: Normal pelvic support.  Urethra No masses tenderness or scarring.  Meatus Normal size without lesions or prolapse.  Cervix: Normal ectropion.  No lesions.  Anus: Normal exam.  No lesions.  Perineum: Normal exam.  No lesions.        Bimanual   Uterus: Normal size.  Non-tender.  Mobile.  AV.  Adnexae: No masses.  Non-tender to palpation.  Cul-de-sac: Negative for abnormality.   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 Pre-op exam   2. Endometrial polyp   3. Postcoital bleeding   4. Pelvic relaxation disorder      Plan:   Orders: Meds ordered this encounter  Medications   metroNIDAZOLE  (FLAGYL ) 500 MG tablet    Sig: Take 1 tablet (500 mg total) by mouth 2 (two) times daily. Begin 5 days prior to scheduled surgery as directed.    Dispense:  10 tablet    Refill:  0     1.  LAVH -anterior and posterior repair -TOT (sling) and bilateral salpingectomy

## 2023-09-02 NOTE — Progress Notes (Signed)
 PRE-OPERATIVE HISTORY AND PHYSICAL EXAM  PCP:  [facility] Subjective:   HPI:  Carmen Turner is a 43 y.o. [id number].  Patient's last menstrual period was 08/17/2023.  She presents today for a pre-op discussion and PE.  She has the following symptoms: Having pelvic relaxation symptoms and stress urinary incontinence.  She also has experienced problems with postcoital bleeding.  Review of Systems:   Constitutional: Denied constitutional symptoms, night sweats, recent illness, fatigue, fever, insomnia and weight loss.  Eyes: Denied eye symptoms, eye pain, photophobia, vision change and visual disturbance.  Ears/Nose/Throat/Neck: Denied ear, nose, throat or neck symptoms, hearing loss, nasal discharge, sinus congestion and sore throat.  Cardiovascular: Denied cardiovascular symptoms, arrhythmia, chest pain/pressure, edema, exercise intolerance, orthopnea and palpitations.  Respiratory: Denied pulmonary symptoms, asthma, pleuritic pain, productive sputum, cough, dyspnea and wheezing.  Gastrointestinal: Denied, gastro-esophageal reflux, melena, nausea and vomiting.  Genitourinary: See HPI for additional information.  Musculoskeletal: Denied musculoskeletal symptoms, stiffness, swelling, muscle weakness and myalgia.  Dermatologic: Denied dermatology symptoms, rash and scar.  Neurologic: Denied neurology symptoms, dizziness, headache, neck pain and syncope.  Psychiatric: Denied psychiatric symptoms, anxiety and depression.  Endocrine: Denied endocrine symptoms including hot flashes and night sweats.   OB History  Gravida Para Term Preterm AB Living  6 5 5  1 5   SAB IAB Ectopic Multiple Live Births   1   5    # Outcome Date GA Lbr Len/2nd Weight Sex Type Anes PTL Lv  6 IAB           5 Term           4 Term           3 Term           2 Term           1 Term             Past Medical History:  Diagnosis Date   Anemia    Hypertension     Past Surgical History:   Procedure Laterality Date   CESAREAN SECTION     CESAREAN SECTION WITH BILATERAL TUBAL LIGATION N/A 11/07/2015   Procedure: CESAREAN SECTION WITH BILATERAL TUBAL LIGATION;  Surgeon: Lamar SHAUNNA Lesches, MD;  Location: [facility] ORS;  Service: Obstetrics;  Laterality: N/A;   CHOLECYSTECTOMY        SOCIAL HISTORY:  Social History   Tobacco Use  Smoking Status Former   Current packs/day: 0.00   Types: Cigarettes   Quit date: 05/24/2008   Years since quitting: 15.2   Passive exposure: Past  Smokeless Tobacco Never   Social History   Substance and Sexual Activity  Alcohol Use Yes    Social History   Substance and Sexual Activity  Drug Use No    History reviewed. No pertinent family history.  ALLERGIES:  Ibuprofen   MEDS:   Current Outpatient Medications on File Prior to Visit  Medication Sig Dispense Refill   metroNIDAZOLE  (METROGEL ) 0.75 % vaginal gel Place 1 Applicatorful vaginally at bedtime. Apply one applicatorful to vagina at bedtime for 5 days (Patient not taking: Reported on 06/24/2023) 70 g 1   traZODone (DESYREL) 50 MG tablet Take 50 mg by mouth at bedtime. (Patient not taking: Reported on 06/24/2023)     No current facility-administered medications on file prior to visit.    Meds ordered this encounter  Medications   metroNIDAZOLE  (FLAGYL ) 500 MG tablet    Sig: Take 1 tablet (500  mg total) by mouth 2 (two) times daily. Begin 5 days prior to scheduled surgery as directed.    Dispense:  10 tablet    Refill:  0     Physical examination BP (!) 142/91   Pulse 67   Ht 5' 3 (1.6 m)   Wt 121 lb 3.2 oz (55 kg)   LMP 08/17/2023   BMI 21.47 kg/m   General NAD, Conversant  HEENT Atraumatic; Op clear with mmm.  Normo-cephalic.  Anicteric sclerae  Thyroid/Neck Smooth without nodularity or enlargement. Normal ROM.  Neck Supple.  Skin No rashes, lesions or ulceration. Normal palpated skin turgor. No nodularity.  Breasts: No masses or discharge.  Symmetric.  No axillary  adenopathy.  Lungs: Clear to auscultation.No rales or wheezes. Normal Respiratory effort, no retractions.  Heart: NSR.  No murmurs or rubs appreciated. No peripheral edema  Abdomen: Soft.  Non-tender.  No masses.  No HSM. No hernia  Extremities: Moves all appropriately.  Normal ROM for age. No lymphadenopathy.  Neuro: Oriented to PPT.  Normal mood. Normal affect.     Pelvic:   Vulva: Normal appearance.  No lesions.  Vagina: No lesions or abnormalities noted.  Support: Normal pelvic support.  Urethra No masses tenderness or scarring.  Meatus Normal size without lesions or prolapse.  Cervix: Normal ectropion.  No lesions.  Anus: Normal exam.  No lesions.  Perineum: Normal exam.  No lesions.        Bimanual   Uterus: Normal size.  Non-tender.  Mobile.  AV.  Adnexae: No masses.  Non-tender to palpation.  Cul-de-sac: Negative for abnormality.   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 Pre-op exam   2. Endometrial polyp   3. Postcoital bleeding   4. Pelvic relaxation disorder      Plan:   Orders: Meds ordered this encounter  Medications   metroNIDAZOLE  (FLAGYL ) 500 MG tablet    Sig: Take 1 tablet (500 mg total) by mouth 2 (two) times daily. Begin 5 days prior to scheduled surgery as directed.    Dispense:  10 tablet    Refill:  0     1.  LAVH -anterior and posterior repair -TOT (sling) bilateral salpingectomy  Pre-op discussions regarding Risks and Benefits of her scheduled surgery.  LAVH The procedure of Laparoscopic Assisted Vaginal Hysterectomy was described to the patient in detail.  We reviewed the  rationale for Hysterectomy and the patient was again informed of other nonsurgical management possibilities for her condition.  She has considered these other options, and desires a Hysterectomy.  We have reviewed the fact that Hysterectomy is permanent and that following the procedure she will not be able to become pregnant or bear children.  We have discussed the following risk factors specifically and the patient has also been informed that additional complications not mentioned may develop:  Damage to bowel, bladder, ureters or to other internal organs, bleeding, infection and the risk from anesthesia.  We have discussed the procedure itself in detail and she has an informed understanding of this surgery.  We have also discussed the recovery period in which physical and sexual activity will  be restricted for a varying degree of time, often 3 - 6 weeks. The Laparoscopic Portion of Hysterectomy has also been reviewed with the patient.  She understands how the laparoscope facilitates the procedure.  We have discussed the abdominal incisions and punctures that will be used.  We have also reviewed the increased Operating Room time often accompanying LAVH.  The slightly increased risk of complications secondary to abdominal punctures, and use of laparoscopic instrumentation has also been discussed in detail. As with all hysterectomies it has now become standard to perform bilateral salpingectomy.  This has been shown to decrease the future risk of ovarian cancer.  I have discussed this with her and she plans to have her fallopian tubes removed at the time of surgery.   I have answered all of her questions and I believe the patient has an informed understanding of the procedure of Laparoscopic Assisted Vaginal Hysterectomy. Anterior Repair I have discussed the procedure of anterior repair and Saron placation.  I have informed the patient that this procedure often corrects or improves stress urinary incontinence,  but that there is certainly no guarantee of her improvement.  The procedure itself was discussed.  The possible damage to the bowel, ureters or urethra was also discussed.  We have reviewed the repositioning of the bladder that often takes place at anterior repair and I have informed her that although unlikely, it is possible that a worsening of her incontinence could occur after this procedure.  I have also discussed with her the necessity of decreased lifting and physical activity following the procedure as well as the possibility that as she gets older, her stress urinary incontinence could slowly return.  The use of vaginal Estrogen or oral Estrogen as well as other medications in the role of both stress and bladder dysynergia incontinence were discussed.  I have discussed the complication of inability to void immediately following the procedure.  The patient is aware that she may go home using a Foley catheter or may be taught the technique of self-catheterization should a complication develop.  All of her questions have been answered and I believe that she has an informed understanding of anterior repair/[place] plication. Posterior Repair Posterior repair was discussed with the patient.  The risks were reviewed and include:  possible damage to rectum and bowel, bleeding, infection and anesthesia.  The benefits were also discussed.  Post-op recovery with special attention to hospital stay and return to sexual function were specifically reviewed.  All her questions were answered, and I believe that she has an informed understanding of Posterior repair.  TOT I have discussed the procedure of tension free vaginal tape using the trans-obturator approach. (TOT).  I have informed the patient that this procedure often corrects or improves stress urinary incontinence.  For patients without urinary incontinence-this procedure is often performed to lower the risk of iatragenic urinary incontinence at the time of  cystocele repair.The patient has been made aware that there is no guarantee of her improvement or the length of time her improvement will last.  The procedure itself was discussed in detail including possible damage to bowel, ureters, urethra and bladder.  I have informed her that the mesh is permanent.  The risk of extrusion of the mesh has also been reviewed.  The management of this complication has been discussed.  The risks of bleeding and infection were also reviewed.  I have specifically discussed the complication of inability to void following the procedure and the patient is aware that  she may go home using a Foley catheter or using a self-catheterization technique.  Possibility of home catheter removal discussed and literature given. In addition, I have discussed with the patient the use of cystoscopy to diagnose bladder injury should there be any question of this occurrence.       Alm DOROTHA Sar, M.D. 09/02/2023 12:18 PM

## 2023-09-13 NOTE — Progress Notes (Deleted)
 Referring Provider:  Alm Sar MD  HPI:  Carmen Turner is a 43 y.o.  [phone]  who presents today for evaluation and management of abnormal cervical cytology.    Prior pap smears:  Date:09/02/2023 Eje:JDRLD HPV: positive HR, undifferentiated 06/12/2020: NILM  HPV:  Not tested  Prior cervical / vaginal findings: *** Date:*** Results: ***  Prior cervical treatment(s): *** Date:*** Results: ***  Symptoms/History:  -Abnormal vaginal discharge: *** -Postmenopausal: *** -Intermenstrual bleeding: *** -Postcoital bleeding: yes -Bleeding problems (non-gyn): *** -Contraception: *** -Number of current sexual partners: *** -Number of partners in lifetime: *** -History of a high risk partner: *** -History of STDs: *** -Smoking: *** -Gardasil Vaccine: ***      ROS:  {Ros - complete:30496}  OB History  Gravida Para Term Preterm AB Living  6 5 5  1 5   SAB IAB Ectopic Multiple Live Births   1   5    # Outcome Date GA Lbr Len/2nd Weight Sex Type Anes PTL Lv  6 IAB           5 Term           4 Term           3 Term           2 Term           1 Term             Past Medical History:  Diagnosis Date   Anemia    Hypertension     Past Surgical History:  Procedure Laterality Date   CESAREAN SECTION     CESAREAN SECTION WITH BILATERAL TUBAL LIGATION N/A 11/07/2015   Procedure: CESAREAN SECTION WITH BILATERAL TUBAL LIGATION;  Surgeon: Lamar SHAUNNA Lesches, MD;  Location: [facility] ORS;  Service: Obstetrics;  Laterality: N/A;   CHOLECYSTECTOMY      SOCIAL HISTORY:  Social History   Substance and Sexual Activity  Alcohol Use Yes    Social History   Substance and Sexual Activity  Drug Use No     No family history on file.  ALLERGIES:  Ibuprofen   She has a current medication list which includes the following prescription(s): metronidazole , metronidazole , and trazodone.  Physical Exam: -Vitals:  LMP 08/17/2023   PROCEDURE: Colposcopy performed with 4% acetic acid  and Lugol's after informed consent obtained.  Physical Exam                            -Aceto-white Lesions Location(s): See above              -Biopsy performed at *** o'clock               -ECC indicated and performed: {yes no:314532}     -Biopsy sites made hemostatic with pressure and Monsel's solution   -Satisfactory colposcopy: {yes no:314532}    -Evidence of Invasive cervical CA :  NO  ASSESSMENT:  Carmen Turner is a 43 y.o. H3E4984 with LSIL***ASCUS and HPV-HR positive*** [birth year] POS***NEG on recent pap (DATE), here for colposcopy today, performed as above without complications.  -ECC and 3 cervical bx sent to pathology -Aftercare instructions for home reviewed, si/sx of when to call/return discussed. -RTC 2 weeks for results, sooner prn  No orders of the defined types were placed in this encounter.          F/U  No follow-ups on file.   Estil Mangle, DO [facility]

## 2023-09-16 ENCOUNTER — Encounter: Admitting: Obstetrics

## 2023-09-16 DIAGNOSIS — R87610 Atypical squamous cells of undetermined significance on cytologic smear of cervix (ASC-US): Secondary | ICD-10-CM

## 2023-09-22 ENCOUNTER — Encounter: Admitting: Obstetrics and Gynecology

## 2023-09-25 ENCOUNTER — Encounter: Payer: Self-pay | Admitting: Urgent Care

## 2023-09-25 ENCOUNTER — Encounter: Payer: Self-pay | Admitting: Certified Registered"

## 2023-09-28 ENCOUNTER — Encounter
Admission: RE | Admit: 2023-09-28 | Discharge: 2023-09-28 | Disposition: A | Discharge status: Home / Self Care | Source: Ambulatory Visit | Attending: Obstetrics and Gynecology | Admitting: Obstetrics and Gynecology

## 2023-09-28 ENCOUNTER — Other Ambulatory Visit: Payer: Self-pay

## 2023-09-28 NOTE — Patient Instructions (Signed)
 Your procedure is scheduled on: 8/11/ 2025  Report to the Registration Desk on the 1st floor of the [facility]. To find out your arrival time, please call [phone] between 1PM - 3PM on: 10/01/2023 Friday If your arrival time is 6:00 am, do not arrive before that time as the [facility] entrance doors do not open until 6:00 am.  REMEMBER: Instructions that are not followed completely may result in serious medical risk, up to and including death; or upon the discretion of your surgeon and anesthesiologist your surgery may need to be rescheduled.  Do not eat food after midnight the night before surgery.  No gum chewing or hard candies.  One week prior to surgery: Stop Anti-inflammatories (NSAIDS) such as Advil , Aleve , Ibuprofen , Motrin , Naproxen , Naprosyn  and Aspirin based products such as Excedrin, Goody's Powder, BC Powder. Stop ANY OVER THE COUNTER supplements until after surgery.  You may however, continue to take Tylenol  if needed for pain up until the day of surgery.  Continue taking all of your other prescription medications up until the day of surgery.  ON THE DAY OF SURGERY ONLY TAKE THESE MEDICATIONS WITH SIPS OF WATER:     No Alcohol for 24 hours before or after surgery.  No Smoking including e-cigarettes for 24 hours before surgery.  No chewable tobacco products for at least 6 hours before surgery.  No nicotine patches on the day of surgery.  Do not use any recreational drugs for at least a week (preferably 2 weeks) before your surgery.  Please be advised that the combination of cocaine and anesthesia may have negative outcomes, up to and including death. If you test positive for cocaine, your surgery will be cancelled.  On the morning of surgery brush your teeth with toothpaste and water, you may rinse your mouth with mouthwash if you wish. Do not swallow any toothpaste or mouthwash.  Use CHG Soap or wipes as directed on instruction sheet.-provided for you    Do not wear jewelry, make-up, hairpins, clips or nail polish.  For welded (permanent) jewelry: bracelets, anklets, waist bands, etc.  Please have this removed prior to surgery.  If it is not removed, there is a chance that hospital personnel will need to cut it off on the day of surgery.  Do not wear lotions, powders, or perfumes.   Do not shave body hair from the neck down 48 hours before surgery.  Contact lenses, hearing aids and dentures may not be worn into surgery.  Do not bring valuables to the hospital. [facility] is not responsible for any missing/lost belongings or valuables.    Notify your doctor if there is any change in your medical condition (cold, fever, infection).  Wear comfortable clothing (specific to your surgery type) to the hospital.  After surgery, you can help prevent lung complications by doing breathing exercises.  Take deep breaths and cough every 1-2 hours. Your doctor may order a device called an Incentive Spirometer to help you take deep breaths. When coughing or sneezing, hold a pillow firmly against your incision with both hands. This is called "splinting." Doing this helps protect your incision. It also decreases belly discomfort.  If you are being admitted to the hospital overnight, leave your suitcase in the car. After surgery it may be brought to your room.  If you are being discharged the day of surgery, you will not be allowed to drive home. You will need a responsible individual to drive you home and stay with you  for 24 hours after surgery.   If you are taking public transportation, you will need to have a responsible individual with you.  Please call the Pre-admissions Testing Dept. at [phone] if you have any questions about these instructions.  Surgery Visitation Policy:  Patients having surgery or a procedure may have two visitors.  Children under the age of 46 must have an adult with them who is not the patient.  Inpatient  Visitation:    Visiting hours are 7 a.m. to 8 p.m. Up to four visitors are allowed at one time in a patient room. The visitors may rotate out with other people during the day.  One visitor age 6 or older may stay with the patient overnight and must be in the room by 8 p.m.   Merchandiser, retail to address health-related social needs:  [email or web address]        Preparing for Surgery with CHLORHEXIDINE GLUCONATE (CHG) Soap  Chlorhexidine Gluconate (CHG) Soap  o An antiseptic cleaner that kills germs and bonds with the skin to continue killing germs even after washing  o Used for showering the night before surgery and morning of surgery  Before surgery, you can play an important role by reducing the number of germs on your skin.  CHG (Chlorhexidine gluconate) soap is an antiseptic cleanser which kills germs and bonds with the skin to continue killing germs even after washing.  Please do not use if you have an allergy to CHG or antibacterial soaps. If your skin becomes reddened/irritated stop using the CHG.  1. Shower the NIGHT BEFORE SURGERY and the MORNING OF SURGERY with CHG soap.  2. If you choose to wash your hair, wash your hair first as usual with your normal shampoo.  3. After shampooing, rinse your hair and body thoroughly to remove the shampoo.  4. Use CHG as you would any other liquid soap. You can apply CHG directly to the skin and wash gently with a scrungie or a clean washcloth.  5. Apply the CHG soap to your body only from the neck down. Do not use on open wounds or open sores. Avoid contact with your eyes, ears, mouth, and genitals (private parts). Wash face and genitals (private parts) with your normal soap.  6. Wash thoroughly, paying special attention to the area where your surgery will be performed.  7. Thoroughly rinse your body with warm water.  8. Do not shower/wash with your normal soap after using and rinsing off the CHG  soap.  9. Pat yourself dry with a clean towel.  10. Wear clean pajamas to bed the night before surgery.  12. Place clean sheets on your bed the night of your first shower and do not sleep with pets.  13. Shower again with the CHG soap on the day of surgery prior to arriving at the hospital.  14. Do not apply any deodorants/lotions/powders.  15. Please wear clean clothes to the hospital.

## 2023-09-28 NOTE — Patient Instructions (Addendum)
 Your procedure is scheduled on:10/04/2023 Monday Report to the Registration Desk on the 1st floor of the [facility]. To find out your arrival time, please call [phone] between 1PM - 3PM on: 10/01/2023  If your arrival time is 6:00 am, do not arrive before that time as the [facility] entrance doors do not open until 6:00 am.  REMEMBER: Instructions that are not followed completely may result in serious medical risk, up to and including death; or upon the discretion of your surgeon and anesthesiologist your surgery may need to be rescheduled.  Do not eat food after midnight the night before surgery.  No gum chewing or hard candies.   One week prior to surgery: Stop Anti-inflammatories (NSAIDS) such as Advil , Aleve , Ibuprofen , Motrin , Naproxen , Naprosyn  and Aspirin based products such as Excedrin, Goody's Powder, BC Powder. Stop ANY OVER THE COUNTER supplements until after surgery.  You may however, continue to take Tylenol  if needed for pain up until the day of surgery.   Continue taking all of your other prescription medications up until the day of surgery.     No Alcohol for 24 hours before or after surgery.  No Smoking including e-cigarettes for 24 hours before surgery.  No chewable tobacco products for at least 6 hours before surgery.  No nicotine patches on the day of surgery.  Do not use any recreational drugs for at least a week (preferably 2 weeks) before your surgery.  Please be advised that the combination of cocaine and anesthesia may have negative outcomes, up to and including death. If you test positive for cocaine, your surgery will be cancelled.  On the morning of surgery brush your teeth with toothpaste and water, you may rinse your mouth with mouthwash if you wish. Do not swallow any toothpaste or mouthwash.  Use CHG Soap or wipes as directed on instruction sheet-provided for you   Do not wear jewelry, make-up, hairpins, clips or nail polish.  For  welded (permanent) jewelry: bracelets, anklets, waist bands, etc.  Please have this removed prior to surgery.  If it is not removed, there is a chance that hospital personnel will need to cut it off on the day of surgery.  Do not wear lotions, powders, or perfumes.   Do not shave body hair from the neck down 48 hours before surgery.  Contact lenses, hearing aids and dentures may not be worn into surgery.  Do not bring valuables to the hospital. [facility] is not responsible for any missing/lost belongings or valuables.   Notify your doctor if there is any change in your medical condition (cold, fever, infection).  Wear comfortable clothing (specific to your surgery type) to the hospital.  After surgery, you can help prevent lung complications by doing breathing exercises.  Take deep breaths and cough every 1-2 hours. Your doctor may order a device called an Incentive Spirometer to help you take deep breaths.   If you are being discharged the day of surgery, you will not be allowed to drive home. You will need a responsible individual to drive you home and stay with you for 24 hours after surgery.    Please call the Pre-admissions Testing Dept. at [phone] if you have any questions about these instructions.  Surgery Visitation Policy:  Patients having surgery or a procedure may have two visitors.  Children under the age of 48 must have an adult with them who is not the patient.  Inpatient Visitation:    Visiting hours are 7  a.m. to 8 p.m. Up to four visitors are allowed at one time in a patient room. The visitors may rotate out with other people during the day.  One visitor age 58 or older may stay with the patient overnight and must be in the room by 8 p.m.   Merchandiser, retail to address health-related social needs:  [email or web address]      Preparing for Surgery with CHLORHEXIDINE GLUCONATE (CHG) Soap  Chlorhexidine Gluconate (CHG) Soap  o  An antiseptic cleaner that kills germs and bonds with the skin to continue killing germs even after washing  o Used for showering the night before surgery and morning of surgery  Before surgery, you can play an important role by reducing the number of germs on your skin.  CHG (Chlorhexidine gluconate) soap is an antiseptic cleanser which kills germs and bonds with the skin to continue killing germs even after washing.  Please do not use if you have an allergy to CHG or antibacterial soaps. If your skin becomes reddened/irritated stop using the CHG.  1. Shower the NIGHT BEFORE SURGERY and the MORNING OF SURGERY with CHG soap.  2. If you choose to wash your hair, wash your hair first as usual with your normal shampoo.  3. After shampooing, rinse your hair and body thoroughly to remove the shampoo.  4. Use CHG as you would any other liquid soap. You can apply CHG directly to the skin and wash gently with a scrungie or a clean washcloth.  5. Apply the CHG soap to your body only from the neck down. Do not use on open wounds or open sores. Avoid contact with your eyes, ears, mouth, and genitals (private parts). Wash face and genitals (private parts) with your normal soap.  6. Wash thoroughly, paying special attention to the area where your surgery will be performed.  7. Thoroughly rinse your body with warm water.  8. Do not shower/wash with your normal soap after using and rinsing off the CHG soap.  9. Pat yourself dry with a clean towel.  10. Wear clean pajamas to bed the night before surgery.  12. Place clean sheets on your bed the night of your first shower and do not sleep with pets.  13. Shower again with the CHG soap on the day of surgery prior to arriving at the hospital.  14. Do not apply any deodorants/lotions/powders.  15. Please wear clean clothes to the hospital.

## 2023-09-29 ENCOUNTER — Inpatient Hospital Stay: Admission: RE | Admit: 2023-09-29 | Source: Ambulatory Visit

## 2023-10-01 ENCOUNTER — Encounter: Payer: Self-pay | Admitting: Urgent Care

## 2023-10-01 ENCOUNTER — Inpatient Hospital Stay: Admission: RE | Admit: 2023-10-01 | Source: Ambulatory Visit

## 2023-10-04 ENCOUNTER — Ambulatory Visit: Admission: RE | Admit: 2023-10-04 | Source: Home / Self Care | Admitting: Obstetrics and Gynecology

## 2023-10-04 DIAGNOSIS — Z01812 Encounter for preprocedural laboratory examination: Secondary | ICD-10-CM

## 2023-10-04 DIAGNOSIS — N930 Postcoital and contact bleeding: Secondary | ICD-10-CM

## 2023-10-04 SURGERY — HYSTERECTOMY, VAGINAL, LAPAROSCOPY-ASSISTED
Anesthesia: Choice

## 2023-10-04 MED ORDER — GABAPENTIN 300 MG PO CAPS
ORAL_CAPSULE | ORAL | Status: AC
  Filled 2023-10-04: qty 1

## 2023-10-04 MED ORDER — CELECOXIB 200 MG PO CAPS
ORAL_CAPSULE | ORAL | Status: AC
  Filled 2023-10-04: qty 2

## 2023-10-04 MED ORDER — CEFAZOLIN SODIUM-DEXTROSE 2-4 GM/100ML-% IV SOLN
INTRAVENOUS | Status: AC
  Filled 2023-10-04: qty 100

## 2023-10-04 MED ORDER — ACETAMINOPHEN 500 MG PO TABS
ORAL_TABLET | ORAL | Status: AC
  Filled 2023-10-04: qty 2

## 2023-10-04 MED ORDER — CHLORHEXIDINE GLUCONATE 0.12 % MT SOLN
OROMUCOSAL | Status: AC
  Filled 2023-10-04: qty 15

## 2023-10-19 ENCOUNTER — Encounter: Admitting: Obstetrics and Gynecology

## 2023-12-13 ENCOUNTER — Other Ambulatory Visit: Payer: Self-pay

## 2023-12-14 NOTE — Discharge Instructions (Addendum)
 [facility]  POST OPERATIVE INSTRUCTIONS FOR DR. ASHLEY, DR. LENNIE, AND DR. [facility]   Take your medication as prescribed.  Pain medication should be taken only as needed.  Keep the dressing clean, dry and intact.  Keep your foot elevated above the heart level for the first 48 hours.  Walking to the bathroom and brief periods of walking are acceptable, unless we have instructed you to be non-weight bearing.  Always wear your post-op shoe when walking.  Always use your crutches if you are to be non-weight bearing.  Do not take a shower. Baths are permissible as long as the foot is kept out of the water.   Every hour you are awake:  Bend your knee 15 times. Flex foot 15 times Massage calf 15 times  Call [facility] [phone]) if any of the following problems occur: You develop a temperature or fever. The bandage becomes saturated with blood. Medication does not stop your pain. Injury of the foot occurs. Any symptoms of infection including redness, odor, or red streaks running from wound.

## 2023-12-14 NOTE — Anesthesia Preprocedure Evaluation (Signed)
 Anesthesia Evaluation    Airway        Dental   Pulmonary Patient abstained from smoking., former smoker          Cardiovascular      Neuro/Psych    GI/Hepatic   Endo/Other    Renal/GU      Musculoskeletal   Abdominal   Peds  Hematology   Anesthesia Other Findings   Reproductive/Obstetrics                              Anesthesia Physical Anesthesia Plan Anesthesia Quick Evaluation

## 2023-12-15 NOTE — Progress Notes (Signed)
 MEDICATIONS FOR POST OP  - Pain Medication:  [x]  Hydrocodone/APAP [NORCO] 5/325mg  Tab #24 Q4-6H prn pain ; advised not to take APAP products concurrently. + Gabapentin  300mg QHS x 3 days; to initiate 1st day post-op.  - DVT ppx: [x]  none requied  - Bowel Regimen: Senna 15mg  QD x 5 days prn constipation  -Anti-Nausea Regimen: Ondansetron  4mg  BID x 5 days prn nausea

## 2024-01-06 ENCOUNTER — Encounter: Payer: Self-pay | Admitting: Anesthesiology

## 2024-01-06 ENCOUNTER — Ambulatory Visit: Admission: RE | Admit: 2024-01-06 | Source: Home / Self Care

## 2024-01-06 SURGERY — CORRECTION, HAMMER TOE
Anesthesia: Choice | Site: Toe | Laterality: Right

## 2024-02-11 NOTE — Progress Notes (Signed)
 Carmen Turner is a 43 y.o. female who is in clinic today for sick visit.  HPI: History of Present Illness A 43 year old female who presents with vomiting, chills, headache, and body aches.  She has had vomiting, chills, headache, leg aches, and lower back pain starting yesterday afternoon. Vomiting continued until this morning, initially with food contents then yellow. She has not taken medication for these symptoms. Chills began while driving home from work and were accompanied by shaking. She reports significant lower back pain but no burning with urination. She denies sore throat, cough, or congestion, though she describes her throat as feeling grassy.   ROS: Review of Systems  Constitutional:  Positive for chills. Negative for fever and malaise/fatigue.  HENT:  Positive for sore throat (scratchy). Negative for congestion, ear pain and sinus pain.   Respiratory:  Negative for cough, shortness of breath and wheezing.   Cardiovascular:  Negative for chest pain.  Gastrointestinal:  Positive for vomiting. Negative for diarrhea and nausea.  Musculoskeletal:  Positive for back pain and myalgias (primarily).  Skin:  Negative for rash.  Neurological:  Positive for headaches.     Allergies  Allergen Reactions   Ibuprofen  Swelling    Past Medical History:  Diagnosis Date   UTI (urinary tract infection)       BP 124/86   Pulse 96   Temp 37.3 C (99.2 F) (Temporal)   Ht 160 cm (5' 2.99)   Wt 53.3 kg (117 lb 9.6 oz)   SpO2 99%   BMI 20.84 kg/m    Physical Exam Constitutional:      General: She is not in acute distress. HENT:     Right Ear: Tympanic membrane, ear canal and external ear normal.     Left Ear: Tympanic membrane, ear canal and external ear normal.     Nose: Nose normal.     Right Nostril: No occlusion.     Left Nostril: No occlusion.     Right Sinus: No maxillary sinus tenderness or frontal sinus tenderness.     Left Sinus: No maxillary sinus tenderness or  frontal sinus tenderness.     Mouth/Throat:     Dentition: Normal dentition.     Pharynx: Oropharynx is clear. Uvula midline.     Tonsils: No tonsillar exudate or tonsillar abscesses.  Cardiovascular:     Rate and Rhythm: Normal rate and regular rhythm.     Heart sounds: S1 normal and S2 normal. No murmur heard.    No friction rub. No gallop.  Pulmonary:     Effort: Pulmonary effort is normal.     Breath sounds: Normal breath sounds.  Musculoskeletal:     Cervical back: Full passive range of motion without pain.  Lymphadenopathy:     Cervical: No cervical adenopathy.      Plan: Assessment & Plan Acute viral syndrome with nausea and vomiting Acute viral syndrome with nausea, vomiting, chills, headache, leg pain, and back pain. Differential includes influenza and COVID-19. Normal exam suggests viral etiology. Vomiting atypical for influenza, but body aches and chills consistent with viral infection. - Administered ondansetron  40 mg every 4 to 8 hours for nausea and vomiting. - Ordered flu and COVID-19 tests. - If flu test positive, prescribe Tamiflu. - Recommended acetaminophen  or ibuprofen  for body aches and chills.  Follow up: PRN

## 2024-03-16 ENCOUNTER — Ambulatory Visit: Admitting: Obstetrics
